# Patient Record
Sex: Female | Born: 1980 | Race: White | Hispanic: No | Marital: Married | Smoking: Current every day smoker
Health system: Southern US, Community
[De-identification: ages and names within clinical notes are randomized; demographics above are authoritative.]

## PROBLEM LIST (undated history)

## (undated) DIAGNOSIS — K589 Irritable bowel syndrome without diarrhea: Secondary | ICD-10-CM

## (undated) HISTORY — DX: Irritable bowel syndrome, unspecified: K58.9

---

## 2000-12-22 ENCOUNTER — Other Ambulatory Visit: Admission: RE | Admit: 2000-12-22 | Discharge: 2000-12-22 | Payer: Self-pay | Admitting: *Deleted

## 2001-02-19 ENCOUNTER — Ambulatory Visit (HOSPITAL_COMMUNITY): Admission: RE | Admit: 2001-02-19 | Discharge: 2001-02-19 | Payer: Self-pay | Admitting: *Deleted

## 2001-02-19 ENCOUNTER — Encounter: Payer: Self-pay | Admitting: *Deleted

## 2001-06-29 ENCOUNTER — Inpatient Hospital Stay (HOSPITAL_COMMUNITY): Admission: RE | Admit: 2001-06-29 | Discharge: 2001-07-01 | Payer: Self-pay | Admitting: *Deleted

## 2003-10-12 ENCOUNTER — Ambulatory Visit (HOSPITAL_COMMUNITY): Admission: RE | Admit: 2003-10-12 | Discharge: 2003-10-12 | Payer: Self-pay | Admitting: *Deleted

## 2004-03-01 ENCOUNTER — Inpatient Hospital Stay (HOSPITAL_COMMUNITY): Admission: AD | Admit: 2004-03-01 | Discharge: 2004-03-03 | Payer: Self-pay | Admitting: *Deleted

## 2006-06-10 HISTORY — PX: HYSTEROTOMY: SHX1776

## 2006-08-19 ENCOUNTER — Ambulatory Visit (HOSPITAL_COMMUNITY): Admission: RE | Admit: 2006-08-19 | Discharge: 2006-08-19 | Payer: Self-pay | Admitting: Obstetrics and Gynecology

## 2006-08-19 ENCOUNTER — Encounter (INDEPENDENT_AMBULATORY_CARE_PROVIDER_SITE_OTHER): Payer: Self-pay | Admitting: Specialist

## 2006-09-23 ENCOUNTER — Ambulatory Visit (HOSPITAL_COMMUNITY): Admission: RE | Admit: 2006-09-23 | Discharge: 2006-09-23 | Payer: Self-pay | Admitting: Obstetrics and Gynecology

## 2007-03-03 ENCOUNTER — Encounter (INDEPENDENT_AMBULATORY_CARE_PROVIDER_SITE_OTHER): Payer: Self-pay | Admitting: Obstetrics and Gynecology

## 2007-03-03 ENCOUNTER — Ambulatory Visit (HOSPITAL_COMMUNITY): Admission: RE | Admit: 2007-03-03 | Discharge: 2007-03-04 | Payer: Self-pay | Admitting: Obstetrics and Gynecology

## 2007-08-09 ENCOUNTER — Emergency Department (HOSPITAL_COMMUNITY): Admission: EM | Admit: 2007-08-09 | Discharge: 2007-08-09 | Payer: Self-pay | Admitting: Emergency Medicine

## 2007-08-09 HISTORY — PX: CHOLECYSTECTOMY: SHX55

## 2007-08-14 ENCOUNTER — Ambulatory Visit (HOSPITAL_COMMUNITY): Admission: RE | Admit: 2007-08-14 | Discharge: 2007-08-14 | Payer: Self-pay | Admitting: General Surgery

## 2007-08-14 ENCOUNTER — Encounter (INDEPENDENT_AMBULATORY_CARE_PROVIDER_SITE_OTHER): Payer: Self-pay | Admitting: General Surgery

## 2009-11-08 ENCOUNTER — Ambulatory Visit: Payer: Self-pay | Admitting: Vascular Surgery

## 2010-02-13 ENCOUNTER — Ambulatory Visit: Payer: Self-pay | Admitting: Vascular Surgery

## 2010-04-18 ENCOUNTER — Ambulatory Visit: Payer: Self-pay | Admitting: Vascular Surgery

## 2010-04-25 ENCOUNTER — Ambulatory Visit: Payer: Self-pay | Admitting: Vascular Surgery

## 2010-06-20 ENCOUNTER — Ambulatory Visit
Admission: RE | Admit: 2010-06-20 | Discharge: 2010-06-20 | Payer: Self-pay | Source: Home / Self Care | Attending: Vascular Surgery | Admitting: Vascular Surgery

## 2010-06-26 ENCOUNTER — Ambulatory Visit: Admit: 2010-06-26 | Payer: Self-pay | Admitting: Vascular Surgery

## 2010-06-26 ENCOUNTER — Ambulatory Visit
Admission: RE | Admit: 2010-06-26 | Discharge: 2010-06-26 | Payer: Self-pay | Source: Home / Self Care | Attending: Vascular Surgery | Admitting: Vascular Surgery

## 2010-07-06 NOTE — Procedures (Unsigned)
DUPLEX DEEP VENOUS EXAM - LOWER EXTREMITY  INDICATION:  Status post ablation of left GSV.  HISTORY:  Edema:  No Trauma/Surgery:  06/20/2010 left GSV ablation Pain:  Yes PE:  No Previous DVT:  No Anticoagulants:  No Other:  DUPLEX EXAM:               CFV   SFV   PopV  PTV    GSV               R  L  R  L  R  L  R   L  R  L Thrombosis    0  0     0     0      0     + Spontaneous   +  +     +     +      +     0 Phasic        +  +     +     +      +     0 Augmentation  +  +     +     +      +     0 Compressible  +  +     +     +      +     0 Competent     +  +     +     +      +     0  Legend:  + - yes  o - no  p - partial  D - decreased  IMPRESSION: 1. Left great saphenous vein successfully ablated approximately 1.5 cm     from junction to distal insertion point in the proximal calf. 2. No deep venous involvement is observed.   _____________________________ Larina Earthly, M.D.  LT/MEDQ  D:  06/26/2010  T:  06/26/2010  Job:  [id number]

## 2010-10-23 NOTE — Procedures (Signed)
LOWER EXTREMITY VENOUS REFLUX EXAM   INDICATION:  Varicose veins.   EXAM:  Using color-flow imaging and pulse Doppler spectral analysis, the  right and left common femoral, superficial femoral, popliteal, posterior  tibial, greater and lesser saphenous veins are evaluated.  There is no  evidence suggesting deep venous insufficiency in the right and left  lower extremities.   The right and left saphenofemoral junctions are not competent with  Reflux of >581milliseconds. The right and left GSV's are not competent  with Reflux of >59milliseconds with the caliber as described below.   The right and left proximal short saphenous veins demonstrate  competency.   GSV Diameter (used if found to be incompetent only)                                            Right         Left  Proximal Greater Saphenous Vein           0.45 cm       0.53 cm  Proximal-to-mid-thigh                     cm            cm  Mid thigh                                 0.47 cm       0.53 cm  Mid-distal thigh                          cm            cm  Distal thigh                              0.60 cm       0.50 cm  Knee                                      0.52 cm       0.61 cm   IMPRESSION:  1. The right and left greater saphenous veins are not competent with      reflux >54milliseconds.  2. The right and left greater saphenous veins are not tortuous.  3. The deep venous system is not competent with reflux >500      milliseconds.  4. The right and left short saphenous veins are competent.         ___________________________________________  Larina Earthly, M.D.   EM/MEDQ  D:  02/13/2010  T:  02/13/2010  Job:  [id number]

## 2010-10-23 NOTE — Assessment & Plan Note (Signed)
OFFICE VISIT   Courtney Velazquez, Courtney Velazquez  DOB:  10/27/1980                                       06/20/2010  [id number]#:[id number]   The patient underwent left great saphenous vein laser ablation from  midcalf to just below the saphenofemoral junction under local anesthesia  in our office.  She also had sclerotherapy to the area in the popliteal  space bilaterally.  She had no immediate complication, was placed in a  compression garment and will see [place] again in 1 week for duplex follow-  up.     Larina Earthly, M.D.  Electronically Signed   TFE/MEDQ  D:  06/20/2010  T:  06/21/2010  Job:  [id number]

## 2010-10-23 NOTE — Assessment & Plan Note (Signed)
OFFICE VISIT   VENIA, RIVERON  DOB:  [birth year]                                       04/18/2010  EAVWU#:[id number]   The patient presents today for the first of staged bilateral laser  ablation of her great saphenous vein for venous hypertension.  She  underwent uneventful laser ablation from her right  saphenous vein  from  her knee region up to just below her saphenofemoral junction.  She also  had sclerotherapy in the popliteal fossa on the right of 0.3% sodium  tetradecyl.  She will be seen again in 1 week for follow-up.     Larina Earthly, M.D.  Electronically Signed   TFE/MEDQ  D:  04/18/2010  T:  04/19/2010  Job:  [id number]

## 2010-10-23 NOTE — Procedures (Signed)
DUPLEX DEEP VENOUS EXAM - LOWER EXTREMITY   INDICATION:  Status post right greater saphenous vein laser ablation.   HISTORY:  Edema:  No.  Trauma/Surgery:  Right greater saphenous vein laser ablation on  04/18/2010.  Pain:  Right medial distal thigh tenderness.  PE:  No.  Previous DVT:  No.  Anticoagulants:  Other:   DUPLEX EXAM:                CFV   SFV   PopV  PTV    GSV                R  L  R  L  R  L  R   L  R  L  Thrombosis    o  o  o     o     o      +  Spontaneous   +  +  +     +     +      o  Phasic        +  +  +     +     +      o  Augmentation  +  +  +     +     +      o  Compressible  +  +  +     +     +      o  Competent     o  o  o     +     +      o   Legend:  + - yes  o - no  p - partial  D - decreased   IMPRESSION:  1. The right greater saphenous vein appears totally occluded,      extending from the distal insertion site to 1.9 cm from the      saphenofemoral junction.  2. No evidence of deep venous thrombosis noted in the right lower      extremity.  3. Reflux of >500 milliseconds noted in the bilateral common femoral      and right superficial femoral veins.    _____________________________  Larina Earthly, M.D.   CH/MEDQ  D:  04/25/2010  T:  04/25/2010  Job:  [id number]

## 2010-10-23 NOTE — H&P (Signed)
NAMEMONICKA, CYRAN               ACCOUNT NO.:  [id number]   MEDICAL RECORD NO.:  [id number]          PATIENT TYPE:  AMB   LOCATION:  SDC                           FACILITY:  [facility]   PHYSICIAN:  Randye Lobo, M.D.   DATE OF BIRTH:  1980/06/17   DATE OF ADMISSION:  03/03/2007  DATE OF DISCHARGE:                              HISTORY & PHYSICAL   DATE OF THE PREOPERATIVE HISTORY AND PHYSICAL EXAMINATION:  February 25, 2007   CHIEF COMPLAINT:  Painful menses.   HISTORY OF PRESENT ILLNESS:  The patient is a 30 year old para 2  Caucasian female, status post laparoscopy for dysmenorrhea on August 19, 2006, with biopsy-proven endometriosis, who presents with continued  painful menstruation and painful bowel movements and urination during  her menstrual cycles.  The patient reports that she is pain-free when  she is not menstruating.  The patient has used multiple hormonal  therapies in the past such as the NuvaRing, Ortho Evra, and multiple  birth control pills, which she has either not tolerated due to side  effects or has had continued dysmenorrhea and problems with breakthrough  bleeding.  The patient declines any future medical therapy and she  requests hysterectomy.  The patient uses vasectomy for birth control and  she declines future fertility.  A plan is made now to proceed with  surgical evaluation and further treatment of her pain.  Of note, the  patient did have a postoperative MRI performed on September 23, 2006, which  showed a possible focal adenomyoma of the uterus, although this could  not be definitive.   The patient's past obstetric and gynecologic history is remarkable for  two vaginal deliveries.  The patient's last Pap smear was performed in  February 2008 and was within normal limits.  She has a history of  polycystic-appearing ovaries from a prior ultrasound with an outside  Jermane Brayboy.   MEDICAL HISTORY:  Significant for irritable bowel syndrome, and she is a  smoker.   PAST SURGICAL HISTORY:  The patient is status post laparoscopy for  endometriosis in March 2008.   MEDICATIONS:  Ibuprofen p.r.n. headache.   ALLERGIES:  No known drug allergies.   SOCIAL HISTORY:  The patient has been married for the last 9 years.  She  is a Futures trader.  She smokes one-half package of cigarettes per day.  She  has set a quit date for her tobacco use of March 03, 2007.   REVIEW OF SYSTEMS:  The patient has intentionally lost 20 pounds over  the last 6 months.   FAMILY HISTORY:  Negative for breast, ovarian, uterine, or colon cancer.   PHYSICAL EXAMINATION:  Height is 5 feet 5 inches, weight is 148 pounds.  GENERAL:  The patient is a young Caucasian female in no acute distress.  HEENT:  Normocephalic, atraumatic.  LUNGS:  Clear to auscultation bilaterally.  HEART:  S1, S2 with a regular rate and rhythm.  There is no evidence of  a murmur, rub, or gallop.  ABDOMEN:  Demonstrates scattered laparoscopic incisions which are well  healed.  The abdomen is  soft and nontender and without evidence of  hepatosplenomegaly or organomegaly.  PELVIC EXAMINATION:  Demonstrates normal external genitalia and a normal  urethra.  The cervix demonstrates no lesions.  The uterus is small and  nontender.  No adnexal masses nor tenderness are appreciated.   IMPRESSION:  The patient is a 30 year old para 2 female with biopsy-  proven endometriosis and a possible current adenomyoma who has  dysmenorrhea which has failed medical therapy.   PLAN:  The patient will undergo a laparoscopically-assisted vaginal  hysterectomy.  Risks, benefits, and alternatives have been discussed  with the patient, who wishes to proceed.  The patient understands that  hysterectomy is not a guarantee that she will be 100% pain-free  following her recovery from her procedure.   The patient will stop her use of nonsteroidal anti-inflammatory  medication prior to her surgery.      Randye Lobo, M.D.  Electronically Signed     BES/MEDQ  D:  03/02/2007  T:  03/03/2007  Job:  [id number]

## 2010-10-23 NOTE — Op Note (Signed)
Courtney Velazquez, Courtney Velazquez               ACCOUNT NO.:  [id number]   MEDICAL RECORD NO.:  [id number]          PATIENT TYPE:  OIB   LOCATION:  9310                          FACILITY:  [facility]   PHYSICIAN:  Randye Lobo, M.D.   DATE OF BIRTH:  August 31, 1980   DATE OF PROCEDURE:  03/03/2007  DATE OF DISCHARGE:                               OPERATIVE REPORT   PROCEDURE:  Laparoscopically assisted vaginal hysterectomy, cystoscopy.   SURGEON:  Randye Lobo, M.D.   ASSISTANT:  Luvenia Redden, M.D.   ANESTHESIA:  General endotracheal, local.   IV FLUIDS:  2600 mL Ringer's lactate.   ESTIMATED BLOOD LOSS:  100 mL.   URINE OUTPUT:  300 mL.   COMPLICATIONS:  None.   INDICATIONS FOR PROCEDURE:  The patient is a 30 year old, para 2,  Caucasian female, status post laparoscopy for dysmenorrhea in March 2008  who has biopsy proven endometriosis, who presented with continued  dysmenorrhea, painful bowel movements, and painful urination during her  menstrual cycles.  The patient reports that she is pain free when she is  not menstruating.  The patient has tried multiple hormonal therapies  such a Nuva ring, Ortho-Evra, and multiple birth control pills which  have either caused intolerance due to side effects or continued  dysmenorrhea and break through bleeding.  The patient declines any  further medical therapy of any kind and she wishes to proceed with  definitive removal of the uterus.  She has had an MRI performed in April  2008 which showed a possible focal area of adenomyosis, although this  was not definitive.  The patient declines any future child bearing and  currently uses a vasectomy for pregnancy prevention.  A plan is now made  to proceed with a laparoscopically assisted vaginal hysterectomy after  risks, benefits, and alternatives are reviewed.   FINDINGS:  Laparoscopy demonstrated a normal uterus, tubes and ovaries.  There was a 2 cm left corpus luteum cyst appreciated.  There was a  0.75  cm area of bluish lesions of endometriosis lateral to the right  uterosacral ligament.  Otherwise, there was no evidence of any  endometriosis throughout the abdomen or pelvis.  There were Masters  windows lateral to the uterosacral ligaments bilaterally.  The liver and  bowel appeared to be unremarkable.  The gallbladder was not visualized  due to the bowel in this area.   Cystoscopy was performed at the termination of the hysterectomy portion  of the procedure and both the ureters were noted to be patent  bilaterally.  There was an area of some scattered erythema lateral to  the right ureter.  This was a very nonspecific erythema to the mucosa.  The urethra appeared to be unremarkable.   DESCRIPTION OF PROCEDURE:  The patient was reidentified in the  preoperative hold area.  She was taken to the operating room after she  received cefoxitin 1 gram IV.  The patient received both TED hose and  PAS stockings for DVT prophylaxis.  In the operating room, general  anesthesia was induced and the patient was then placed in a  dorsal  lithotomy position.  The abdomen and vagina were sterilely prepped and  draped.  A Foley catheter was placed inside the bladder.  A single tooth  tenaculum was placed on the anterior cervical lip and this was replaced  with a Hulka tenaculum.   A 1 cm umbilical incision was created sharply with a scalpel.  A 10 mm  trocar was inserted directly into the peritoneal cavity.  The  laparoscope confirmed proper placement.  A CO2 pneumoperitoneum was  achieved and the patient was placed in the Trendelenburg position.  5 mm  incisions were created with a scalpel in the right and left lower  quadrants.  5 mm trocars were inserted into the peritoneal cavity under  direct visualization of the laparoscope.  An inspection of the pelvic  and abdominal organs was performed and the findings are as noted above.   The procedure began on the patient's right hand side.  The  right ureter  was identified.  The proximal fallopian tube, right round ligament, and  the right utero-ovarian ligament were then sequentially cauterized with  the tripolar instrument and sharply divided.  The dissection continued  through the posterior leaf of the broad ligament using the same  instrument.  The uterine artery on the same side was then skeletonized  using the same instrument.  The bladder flap was not taken down in the  midline portion of the vesicouterine fold.  The same procedure that was  performed on the right was repeated on the left hand side after the left  ureter was also identified.  The right ureter was identified and the  lesion of endometriosis near the right uterosacral ligament was then  cauterized with the tripolar instrument, as the ureter was out of the  field of the endometriosis.   Hemostasis was good at this time and the remainder of the procedure was  performed vaginally.  The pneumoperitoneum was released and the  laparoscopic instruments were removed from the trocars.   The patient was then placed in the high lithotomy position.  A weighted  speculum was placed inside the vagina and the Hulka was replaced by a  tenaculum on the anterior and posterior cervical lips.  The cervix was  circumferentially injected with 0.5% lidocaine with 1:200,000 of  epinephrine.  The cervix was then circumscribed with a scalpel.  The  posterior cul-de-sac was entered sharply with Mayo scissors.  Digital  exam confirmed proper entry into this location.  A long weighted  speculum was placed inside the posterior cul-de-sac.   Each of the uterosacral ligaments were then clamped, sharply divided,  and suture ligated with transfixing sutures of 0 Vicryl.  The bladder  was dissected off of the cervix anteriorly.  The cardinal ligaments were  then sequentially clamped, sharply divided, and suture ligated with 0  Vicryl suture.  The cervix was noted to be fairly long and  this needed  to be performed in multiple bites.  The anterior cul-de-sac was then  entered sharply and, again, digital exam confirmed proper entry into  this location.  The Deaver was placed into the anterior cul-de-sac.  The  remainder of the cardinal ligaments were then clamped, sharply divided,  and suture ligated with 0 Vicryl.  At this time, the dissection met the  cautery line from the laparoscopic portion of the procedure and the  specimen was sharply excised.  Transfixing sutures of 0 Vicryl were  placed along the top pedicles on each side.  The  uterus and cervix were  then sent to pathology.  The posterior vaginal cuff was whip stitched  with a running locked suture of 0 Vicryl.  There was some bleeding noted  along the uterosacral ligament on the patient's right hand side.  A  figure-of-eight suture needed to be placed along the upper distal  portion of the uterosacral ligament on the patient's right hand side.   The culdoplasty suture was performed at this time.  The suture of 0  Vicryl was brought through the vagina and into the posterior cul-de-sac  at the 6 o'clock position, down through the distal left uterosacral  ligament, across the posterior cul-de-sac in a pursestring fashion, down  through the distal right uterosacral ligament and then out through the  cul-de-sac and into the vagina, again at the 6 o'clock position.  The  remaining pedicles were examined and found to be hemostatic.  The vagina  was closed with a running locked suture of 0 Vicryl.  The McCall's  culdoplasty suture was tied for good elevation and support of the  vagina.   A decision was made to proceed with the cystoscopy based on the figure-  of-eight suture which needed to be placed near the uterosacral ligament  on the patient's right-hand side.  This did document patent ureters  bilaterally.  The indigo carmine was noted to flux from each of the  ureters.  The Foley catheter was replaced and the  cystoscopy fluid was  drained from the bladder.   Final laparoscopy was performed after the CO2 pneumoperitoneum was  achieved.  There was some bleeding from the underside of the bladder  anteriorly and this responded to coagulation with the tripolar  instrument.  The stump of the right fallopian tube and right utero-  ovarian ligament were also coagulated.  With good hemostasis at this  time, when the pneumoperitoneum was partially released, the decision was  made to finish the procedure.  The laparoscopic trocars were removed  under visualization of the laparoscope.  The pneumoperitoneum was  entirely released and the umbilical trocar and the laparoscope were  removed simultaneously.  The skin incisions were closed with  subcuticular sutures of 4-0 Vicryl.  Pressure dressings were placed over  this.   The patient was cleansed of Betadine, extubated, and escorted to the  recovery room in stable and awake condition.  There were no  complications to the procedure.  All needle, instrument, sponge counts  were correct.      Randye Lobo, M.D.  Electronically Signed     BES/MEDQ  D:  03/03/2007  T:  03/04/2007  Job:  [id number]   cc:   Corrie Mckusick, M.D.  Fax: [phone]

## 2010-10-23 NOTE — Consult Note (Signed)
NEW PATIENT CONSULTATION   GWENDOLINE, JUDY  DOB:  11-May-1981                                       11/08/2009  ZOXWR#:[id number]   The patient presents today for evaluation of lower extremity discomfort.  She is a very pleasant 30 year old white female with several components  of the discomfort in her lower extremities.  Of note, during 2 prior  pregnancies, she had marked varicosities in her lateral right ankle,  medial right calf, and medial right thigh.  These were more pronounced  with her second pregnancy and to a great degree resolved after delivery.  She continues to have some fullness over these areas and does report a  sensation of heaviness bilaterally.  This is somewhat improved with  elevation.  She does not have a history of DVT or bleeding.  She has a  second component where she notes discoloration of her lower extremities.  She reports that if she stands, she has some blotchiness and red-purple  discoloration beginning at her feet and extending up to her thighs.  She  reports this can occur in her hands, but to a lesser degree.   Her past history is significant only for migraines with aura.  She does  have a history of partial hysterectomy, cholecystectomy, and history of  endometriosis.   FAMILY HISTORY:  Negative for premature atherosclerotic disease.   SOCIAL HISTORY:  She is married with 2 children.  She is a Futures trader.  She smokes 1/2 to 1 pack of cigarettes per day and has a quit date  planned for [birth year] her birthday.   REVIEW OF SYSTEMS:  She denies any weight loss or weight gain.  Her  weight is 134 pounds.  She is 5 feet 4 inches tall.  She has shortness of breath with exertion.  GI/GU/CARDIAC/VASCULAR:  Otherwise negative.  NEUROLOGIC:  Positive for dizziness and headache.  MUSCULOSKELETAL/PSYCHIATRIC/ENT/HEMATOLOGIC/SKIN:  Negative.   PHYSICAL EXAM:  Well-developed, well-nourished, white female appearing  stated age.  Blood  pressure is 113/73, heart rate 105, respirations 16.  She is in no acute stress.  HEENT is normal.  Musculoskeletal shows no  major deformity or cyanosis.  Neurologic:  No focal weakness or  paresthesias.  Skin without ulcers or rashes.  She does have prominent  saphenous vein with some moderate varices in her medial right thigh.  She does have telangiectasia and reticular veins in her popliteal fossae  bilaterally.   She underwent handheld SonoSite duplex by myself and this shows enlarged  saphenous vein with reflux bilaterally.  I have discussed this at length  with the patient.  I do not feel that her discoloration is related to  significant arterial venous pathology.  She does have normal 2+ dorsalis  pedis pulses bilaterally.  I feel that this is related to more of a  vasospastic issue with flow to her skin.  This is particularly in light  of the transient nature of this.  She was reassured with this.  I did  explain treatment options for her venous hypertension.  We have fitted  her today with 20 to 30 mmHg graduated compression garments, thigh-high,  and instructed on the use of these.  We will see her again in 3 months  for followup and formal venous duplex.     Larina Earthly, M.D.  Electronically Signed  TFE/MEDQ  D:  11/08/2009  T:  11/09/2009  Job:  [id number]   cc:   [facility], [place], [place]  Corrie Mckusick, M.D.

## 2010-10-23 NOTE — Assessment & Plan Note (Signed)
OFFICE VISIT   Courtney Velazquez, Courtney Velazquez  DOB:  May 31, 1981                                       04/25/2010  AYTKZ#:[id number]   The patient presents today for 1-week follow-up after right great  saphenous vein laser ablation.  She did quite well with the procedure  and has had minimal discomfort following this.   On physical exam she has the typical amount of bruising related to this  and mild tenderness.  She does have obvious clot in her saphenous vein  as anticipated on physical exam.  She underwent venous duplex today and  this shows closure of her saphenous vein from the insertion site at the  level of the knee to just below the saphenofemoral junction and no  evidence of DVT.  I am quite pleased with her initial result, as is the  patient.  We will plan to see her again in early January for a staged  left great saphenous vein ablation.     Larina Earthly, M.D.  Electronically Signed   TFE/MEDQ  D:  04/25/2010  T:  04/26/2010  Job:  [id number]   cc:   Dr. Rema Fendt

## 2010-10-23 NOTE — Assessment & Plan Note (Signed)
OFFICE VISIT   Courtney Velazquez, Courtney Velazquez  DOB:  1980-07-18                                       02/13/2010  UEAVW#:[id number]   The patient presents today for continued discussion regarding venous  hypertension in both lower extremities.  I had seen her initially in  June of 2011.  She has worn her graduated compression stockings and  reports that she still has difficulty with leg pain.  Any activities  requiring prolonged standing, specifically cooking and cleaning  activities childcare, are difficult due to these leg pains.  She reports  that with prolonged standing she gets an achy sensation from her knees  down into her ankles.  She does not have any history of DVT.  She  underwent a venous duplex in our office and this shows reflux in her  great saphenous veins bilaterally and also at the common femoral vein at  the saphenofemoral junction.  She does not have any DVT.  I discussed  these with the patient and her husband present.  I explained that she  clearly does have venous reflux in her superficial system and that this  would typically be responsible for the type of pain that she is having.  I did discuss the option of a staged bilateral laser ablation for venous  hypertension.  She understands and wishes to proceed.  She reports that  the right leg is somewhat worse in her left and we will proceed with  this initially.  She understands the treatment and recovery and the  potential risks.     Larina Earthly, M.D.  Electronically Signed   TFE/MEDQ  D:  02/13/2010  T:  02/14/2010  Job:  [id number]   cc:   Dr. Assunta Found

## 2010-10-23 NOTE — Assessment & Plan Note (Signed)
OFFICE VISIT   Courtney Velazquez, Courtney Velazquez  DOB:  1980-12-22                                       06/26/2010  ZOXWR#:[id number]   The patient presents today for follow-up of her left leg laser ablation  of her left great saphenous vein on 06/20/2010.  She had her right leg  treatment approximately 2 weeks prior to this..  She does have some  irritation over the popliteal space with some linear blisters related to  the compression garment.  These are healing.  She also has some numbness  over the left pretibial area related to irritation of the saphenous  nerve and I have discussed this.  I explained the resolution of this  with this patient and her husband present.  She has minimal bruising and  minimal swelling.  She underwent repeat venous duplex today and this  shows successful ablation of her left great saphenous vein from the  distal insertion site in the mid calf to the level of the left femoral  junction, approximately 1.5 cm below the junction, and no evidence of  DVT.  I am quite pleased with her result as is this patient.  She will  continue her activity.  She will discontinue the use of her compression  garment due to the blistering and irritation of the popliteal space.  She will see [place] again on an as-needed basis.     Larina Earthly, M.D.  Electronically Signed   TFE/MEDQ  D:  06/26/2010  T:  06/27/2010  Job:  [id number]

## 2010-10-23 NOTE — Op Note (Signed)
Courtney Velazquez, Courtney Velazquez               ACCOUNT NO.:  [id number]   MEDICAL RECORD NO.:  [id number]          PATIENT TYPE:  AMB   LOCATION:  DAY                           FACILITY:  [facility]   PHYSICIAN:  Courtney Pillar, MD      DATE OF BIRTH:  [birth year]   DATE OF PROCEDURE:  08/14/2007  DATE OF DISCHARGE:                               OPERATIVE REPORT   PREOPERATIVE DIAGNOSIS:  Cholelithiasis.   POSTOPERATIVE DIAGNOSIS:  Cholelithiasis.   PROCEDURE:  Laparoscopic cholecystectomy.   SURGEON:  Courtney Pillar, MD.   ANESTHESIA:  1. General endotracheal.  2. Local anesthetic 1% Sensorcaine plain.   ESTIMATED BLOOD LOSS:  Scant.   SPECIMEN:  Gallbladder.   INDICATIONS:  The patient is a 30 year old female who presented to my  office as an outpatient with an approximate 5 day history of right upper  quadrant and epigastric abdominal pain.  She had an extensive workup  demonstrating gallstones on a right upper quadrant ultrasound as well as  some gallbladder wall thickening.  She was treated initially with  conservative management with pain medication with some resolution of her  symptomatology but had a continuous persistent aching and right upper  quadrant abdominal pain consistent with resolving acute cholecystitis.  The risks, benefits and alternatives of laparoscopic, possible open,  cholecystectomy were discussed at length with the patient.  The risks  including, but not limited to, the risks of bleeding, infection, bile  leak, small bowel injury, common bile duct injury as well as the  possibility of intraoperative cardiac or pulmonary events were discussed  at length with the patient.  The patient's questions and concerns were  addressed and patient was consented for the planned procedure.   OPERATION:  The patient was taken to the operating room and was placed  in the supine position on the operating table, at which time a general  anesthetic was administered.  Once the patient  was asleep she was  endotracheally intubated by Anesthesia.  At this point, her abdomen was  prepped and draped in the usual fashion.  A stab incision was created  supraumbilically with an 11 blade scalpel.  Additional dissection down  into the subcuticular tissue was carried out using a Kocher clamp which  was utilized to grasp the anterior abdominal fascia and lift this  anteriorly.  A Veress needle was inserted.  A saline drop test was  utilized to confirm intraperitoneal placement and then pneumoperitoneum  was initiated.  Once sufficient pneumoperitoneum was obtained an 11 mm  trocar was inserted over the laparoscope, allowing visualization of the  trocar entering into the peritoneum.  The inner cannula was then removed  and the laparoscope was reinserted.  There was no evidence of any Veress  or trocar placement injury.  At this time the remaining trocars were  placed with an 11 mm trocar in the epigastrium, a 5 mm trocar between  the two 11 mm trocars, and a 5 mm trocar in the right lateral abdominal  wall; these were all placed in a similar fashion making a skin incision  and placement of  the trocars under direct visualization.  At this point  the fundus of the gallbladder was identified, it was lifted up and over  the right lobe of the liver.  Blunt dissection was utilized to strip the  peritoneum off the infundibulum of the gallbladder, this allowed  visualization of the cystic artery which was anterior.  A window was  created behind this.  This was clearly heading towards the gallbladder.  Two Endoclips were placed proximally, one distally and the cystic artery  was divided between the two most distal clips.  Similarly, the cystic  duct was identified.  A window was created behind the cystic duct.  Three Endoclips were placed proximally, one distally and the cystic duct  was divided between the most distal clips.  At this point electrocautery  was utilized to dissect the  gallbladder free from the gallbladder fossa.  A small cholecystotomy was created during this, allowing some bile  spillage but this was minimal.  The stones were observed within the lost  bile.  The gallbladder was removed and placed in the endo catch bag  which was placed up and over the right lobe of the liver.  At this point  the suction ureter was brought to the field, it was utilized to irrigate  copiously the gallbladder fossa until the returning aspirate was clear.  Electrocautery was utilized to obtain hemostasis on the gallbladder  fossa and a piece of Surgicel was placed into the gallbladder fossa.  At  this point, the attention was turned to closure after re-evaluating the  staples, noting that they were in excellent position with no evidence of  any bleeding or bile leak.   At this time, using an Endo Close suture passing device a #2-0 Vicryl  was passed through both 11 mm trocar sites.  With these sutures in  place, the endo catch bag was grasped and pulled through the epigastric  trocar site.  The gallbladder was removed in an intact endo catch bag  which was placed on the back table and was sent as a permanent specimen  to pathology.  At this point pneumoperitoneum was evacuated, the Vicryl  sutures were secured.  A #4-0 Monocryl was utilized in a subcuticular  running suture at all 4 trocar sites to reapproximate the skin edges.  At this point, the local anesthetic was instilled, the skin was washed  and dried with a moistened dry towel, benzoin was applied around the  incision, 1/2-inch Steri-Strips were placed and the drapes were removed.  The patient was allowed to come out of general anesthetic and was  transferred back to her regular hospital bed in stable condition.  At  the conclusion of the procedure, all instrument, sponge and needle  counts were correct.  The patient tolerated the procedure well.      Courtney Pillar, MD  Electronically Signed     BZ/MEDQ   D:  08/14/2007  T:  08/14/2007  Job:  [id number]   cc:   Dr. Phillips Odor

## 2010-10-23 NOTE — H&P (Signed)
Courtney Velazquez, Courtney Velazquez               ACCOUNT NO.:  [id number]   MEDICAL RECORD NO.:  [id number]          PATIENT TYPE:  AMB   LOCATION:  DAY                           FACILITY:  [facility]   PHYSICIAN:  Tilford Pillar, MD      DATE OF BIRTH:  10-14-1980   DATE OF ADMISSION:  DATE OF DISCHARGE:  LH                              HISTORY & PHYSICAL   CHIEF COMPLAINT:  Right upper quadrant epigastric abdominal pain.   HISTORY OF PRESENT ILLNESS:  Patient is a 30 year old female, who  presents to my office after approximately a one-week history of  increasing epigastric abdominal pain.  She has had similar symptoms in  the past, but never this severe and never this long a duration.  She  then states that, on Friday, the pain became worse, has had positive  emesis on multiple occasions with increasing nausea.  She started to  have resolution of her symptomatology and then ate potato soup and noted  increasing abdominal pain following that.  The pain has been described  as a sharp abdominal pain with radiation to the back.  She did have a  temperature on Friday of 102 degrees, Fahrenheit, taken by oral  thermometer.  She has had no other febrile spikes since that time.  She  has had increasing flatus and has had some diarrhea.  She has not  noticed any hematochezia, no melena.  She has had no acholic stools.  She has had no history of jaundice.   PAST MEDICAL HISTORY:  None.   PAST SURGICAL HISTORY:  Positive for a vaginal hysterectomy in 2008.   MEDICATIONS:  Reglan, Vicodin, both of which were started in the  emergency department over the weekend.  No prior medications.   ALLERGIES:  No known drug allergies.   SOCIAL HISTORY:  She is a half-pack to one-pack-per-day smoker.  No  alcohol use.  No recreational drug use.  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  Pregnancies times two.   FAMILY HISTORY:  She does have significant family history of biliary  disease, no history of biliary cancers, no  history of GI cancers.   REVIEW OF SYSTEMS:  CONSTITUTIONAL:  Positive fevers and chills, as per  HPI.  Occasional headaches.  EYES:  Unremarkable.  EARS, NOSE, THROAT:  Unremarkable.  RESPIRATORY:  Unremarkable.  CARDIOVASCULAR:  Unremarkable.  GASTROINTESTINAL:  Abdominal pain, nausea and vomiting,  all as per HPI.  GENITOURINARY:  Unremarkable, although on questioning,  she does admit to some dysuria.  She has denied any hematochezia.  MUSCULOSKELETAL:  She has history of back arthralgias.  SKIN:  Unremarkable.  ENDOCRINE:  Unremarkable.  NEUROLOGIC:  Occasional  sensations of feeling dizzy.   PHYSICAL EXAM:  On exam, patient is a healthy, well-developed, calm  individual.  She is in no acute distress.  She is alert and oriented  times three.  HEENT:  Scalp - no deformities, no masses.  Eyes - pupils equal, round,  reactive.  Extraocular movements are intact.  No scleral icterus, no  conjunctival pallor.  Ears - no diminished hearing on evaluation.  Oral  mucosa pink.  Normal occlusion.  NECK:  Trachea is midline.  No cervical lymphadenopathy is apparent.  PULMONARY:  Unlabored respirations.  No wheezing, no crackles.  She is  clear to auscultation in bilateral lung fields.  CARDIOVASCULAR:  Regular rate and rhythm.  No murmurs or gallops are  appreciated.  She has 2+ radial pulses bilaterally.  ABDOMINAL EXAM:  Abdomen is soft.  She has positive bowel sounds.  She  does have mild to moderate right upper quadrant abdominal pain.  She has  no actual elicited Murphy sign.  No hernias, no masses are apparent.  SKIN:  Warm and dry.   PERTINENT LABORATORY AND RADIOGRAPHIC STUDIES:  Right upper quadrant  ultrasound demonstrated gallbladder stones.  Recent CBC demonstrated a  normal white blood cell count.  A liver panel was within normal limits.   ASSESSMENT AND PLAN:  Cholelithiasis.  At this time, patient continues  to have right upper quadrant abdominal pain and in palpation of the  area  of the gallbladder, although she did not have a classic Murphy's sign.  Her pain and symptomatology and clinical presentation are consistent  with biliary disease and suspect she has continued inflammatory changes  secondary to this.  The risks, benefits, and alternatives of a  laparoscopic, possible open, cholecystectomy were discussed at length  with the patient, including, but not limited to the risk of bleeding,  infection, bile leak, small-bowel injury, common bile duct injury, as  well as the possibility of intraoperative pulmonary or cardiac events.  At this time, it was also discussed with the patient that, although her  laboratory and physical evaluations do not suggest other etiologies for  her symptomatology, symptoms such as gastroesophageal reflux or  ulcerative disease of the stomach or duodenum are also possible and  could be contributing to some of her symptomatology and should any of  her symptoms persist after removal of her gallbladder, additional workup  with gastroenterology evaluation would be necessary.  Patient  understands this and does wish to proceed with a laparoscopic, possible  open, cholecystectomy.      Tilford Pillar, MD  Electronically Signed     BZ/MEDQ  D:  08/11/2007  T:  08/12/2007  Job:  [id number]   cc:   Corrie Mckusick, M.D.  Fax: [phone]

## 2010-10-26 NOTE — Discharge Summary (Signed)
Courtney Velazquez, Courtney Velazquez               ACCOUNT NO.:  [id number]   MEDICAL RECORD NO.:  [id number]          PATIENT TYPE:  INP   LOCATION:  A402                          FACILITY:  [facility]   PHYSICIAN:  Langley Gauss, M.D.   DATE OF BIRTH:  [birth year]   DATE OF ADMISSION:  03/01/2004  DATE OF DISCHARGE:  09/24/2005LH                                 DISCHARGE SUMMARY   DATE OF EPIDURAL:  March 02, 2004.   DATE OF DELIVERY:  March 02, 2004.   DISCHARGE DIAGNOSES:  Term intrauterine pregnancy, presenting in early  labor.   DELIVERY FORM:  1.  Spontaneous assisted vaginal delivery of a 7-pound, 6-ounce female      infant.  2.  Repair of second-degree perineal laceration as well as vaginal sidewall      tears.   COMPLICATIONS:  Nuchal cord x2 is reduced at the time of delivery.  No  compression was noted during the course of labor.  Additionally, postpartum  hemorrhage immediate, secondary to uterine atony and laceration repair, with  resultant anemia.   PERTINENT LABORATORY STUDIES:  Admission hemoglobin and hematocrit 11.1/31.1  with a white count of 9.2.  Immediately following delivery in the early  a.m., hemoglobin 8.2, hematocrit 23.1.  Equilibration has now occurred on  postpartum day #1 to hemoglobin of 6.5, hematocrit 18.6 with a white count  of 9.2.  O positive blood type.  RPR is nonreactive.  GBS status is  negative.   DISPOSITION:  Follow up in the office in one week's time to check suture  healing, also to check response to iron therapy.   DISCHARGE MEDICATIONS:  1.  Tylox #30 for postpartum uterine cramping.  2.  Hemocyte F, one p.o. daily, #30, with one refill.   DISCHARGE INSTRUCTIONS:  1.  The patient is advised of the importance of adherence to the iron      therapy.  2.  In addition, she is advised that should any heavy bleeding occur, she      needs to contact us immediately as she does not have a large amount of      reserve circulatory volume.   HOSPITAL COURSE:  Pertinently, the patient has had no significant bleeding  since the laceration repair was continued.  Thus, it can not be contended  that the patient is at risk for renewed bleeding.  She has been fully  ambulatory during this postpartum stay, and has tolerated the anemia very  well.  She has only some weakness, but no complaints of a chronic headache.  No fainting episodes.  In addition, she is absolutely positively adverse to  receiving any blood products per personal choice.   The patient presented the p.m. of March 01, 2004, in early labor.  Amniotomy was performed with findings of clear amniotic fluid.  Fetal scalp  electrode documented reassuring fetal heart rate.  Delivery was performed  with resultant laceration repair.  Postpartum, the patient did well.  She  did have one episode of almost fainting immediately following delivery.  However, she was in her husband's arms.  Subsequently, she did  well during  the postpartum course.  She has breast fed.  No signs or symptoms of anemia  at the time of discharge.  The perineum is examined.  The repair is noted to  be intact and permits passage of two fingers.  No resultant bleeding during  the examination.  The patient is thus discharged to home, March 03, 2004.      DC/MEDQ  D:  03/03/2004  T:  03/03/2004  Job:  [id number]

## 2010-10-26 NOTE — Op Note (Signed)
Courtney Velazquez, Courtney Velazquez               ACCOUNT NO.:  [id number]   MEDICAL RECORD NO.:  [id number]          PATIENT TYPE:  INP   LOCATION:  A402                          FACILITY:  [facility]   PHYSICIAN:  Langley Gauss, M.D.   DATE OF BIRTH:  September 03, 1980   DATE OF PROCEDURE:  03/02/2004  DATE OF DISCHARGE:                                 OPERATIVE REPORT   DIAGNOSIS:  Thirty-eight plus week intrauterine pregnancy in labor.   PROCEDURES:  March 03, 2004, placement of continuous lumbar epidural  analgesia.  March 03, 2004,  A.  Spontaneous cystovaginal delivery of 7 pound 6 ounce female infant.  B.  Repair of second degree perineal laceration.  C.  Repair of vaginal extensions.  D.  Epidural.   COMPLICATIONS OF DELIVERY:  A nuchal cord x2 without compression was noted  at time of delivery.  In addition, encountered was postpartum hemorrhage  secondary to a combination of uterine atony as well as bleeding from the  vaginal lacerations.   Delivery performed by Dr. Roylene Reason. Lisette Grinder.   ESTIMATED BLOOD LOSS:  600 mL.   ANALGESIA:  Epidural supplemented with 20 mL 1% lidocaine plain in the  midline of the perineal body.   PEDIATRICIAN:  Dr. Phillips Odor.   SPECIMENS:  Arterial cord gas and cord blood to pathology.   LABORATORY:  Placenta was examined and noted to be apparently intact with a  3-vessel umbilical cord.  A CBC is requested postpartum.   SUMMARY:  Patient presented p.m. of March 02, 2004, having bloody show  after a digital examination in the office.  She had been noted to be 3 cm.  When she arrived to labor and delivery she was 4 cm dilated, having regular  uterine contractions.  Amniotomy was performed.  Findings revealed clear  amniotic fluid, fetal scalp electrode is placed.  Initially, patient  requested only IV Nubain and Phenergan for pain relief.  However, in the  early a.m. of March 03, 2004, patient requested epidural.  Epidural was  placed without  difficulty.  Subsequently, patient progressed fairly rapidly  from 9+ to 10 cm dilatation.  She was then managed expectantly with an  excellent epidural block in place.  There was descent to the vertex to the  perineal floor, at which time she was placed in the dorsal lithotomy  position, prepped and draped in the usual sterile manner.  The bladder had  been straight cathed with a red rubber catheter placed by the nursing staff.  The patient pushed well during a short second stage of labor.  Excellent  perineal support was provided and a well controlled delivery.  Mouth and  nares bulb suctioned of clear amniotic fluid.  Nuchal cord x2 is reduced.  Spontaneous rotation occurred to her right anterior shoulder position.  Gentle downward traction combined with expulsive efforts resulted in  delivery of this anterior shoulder as well as the remainder of the infant  without difficulty.  Umbilical cord is milked towards the infant, cord is  doubly clamped and cut.  Infant is placed on the maternal abdomen for  bonding  purposes.  Arterial cord gas and cord blood are then obtained.  General traction on the umbilical cord results in separation which, upon  examination, is thought to be an intact placenta with associated three-  vessel umbilical cord.  Examination of genital tract reveals second degree  perineal laceration after infiltrating 20 mL 1% lidocaine plain.  This was  repaired utilizing 0 chromic in a running locked fashion on the vaginal  mucosa followed by two layer closure of 0 chromic on the perineal body.  There continues to be bright red vaginal bleeding noted.  Fundal massage is  performed with evacuation of several clots and obtaining excellent uterine  tone with the fundus beneath the umbilicus.  There continues to be bright  red bleeding.  A vaginal packing is placed at this time which does reveal  that active bleeding is noted to be near the vaginal opening.  Examination  at this  time reveals two extensions of the vaginal lacerations on both the  right and the left side.  These required additional suturing with 0 chromic  in a running lock fashion to assure hemostasis.  This restores the normal  anatomy.  The vaginal packing is removed and only a pad is placed  externally.  Patient tolerated the procedure very well.  She did have some  hypotension with a systolic blood pressure in the 70s but pulse remained  100.  With continuation of IV fluids, systolic blood pressure returned to  the 90s.  Following delivery, patient is taken out of dorsal lithotomy  position, she is rolled to her side, at which time the epidural catheter was  removed with the blue tip noted to be intact.   TOTAL ESTIMATED BLOOD LOSS:  600 mL.   CBC is requested following delivery.      DC/MEDQ  D:  03/02/2004  T:  03/03/2004  Job:  [id number]

## 2010-10-26 NOTE — Op Note (Signed)
Courtney Velazquez, Courtney Velazquez               ACCOUNT NO.:  [id number]   MEDICAL RECORD NO.:  [id number]          PATIENT TYPE:  AMB   LOCATION:  SDC                           FACILITY:  [facility]   PHYSICIAN:  Randye Lobo, M.D.   DATE OF BIRTH:  07/17/1980   DATE OF PROCEDURE:  08/19/2006  DATE OF DISCHARGE:                               OPERATIVE REPORT   PREOPERATIVE DIAGNOSES:  1. Dysmenorrhea.  2. Dyspareunia.   POSTOPERATIVE DIAGNOSES:  1. Dysmenorrhea.  2. Dyspareunia.  3. Mild endometriosis.   PROCEDURE:  Laparoscopy with peritoneal biopsy.   SURGEON:  Conley Simmonds, M.D.   ANESTHESIA:  General endotracheal, local with 0.25% Marcaine.   IV FLUIDS:  1100 mL Ringer's lactate.   ESTIMATED BLOOD LOSS:  Minimal.   URINE OUTPUT:  Quantity sufficient.   COMPLICATIONS:  None.   INDICATIONS FOR PROCEDURE:  The patient is a 30 year old para 2  Caucasian female who presented with persistent painful menses.  The  patient was also reporting painful intercourse and she describes painful  bowel movements and painful voiding during her menstruation.  The  patient had previously had an ultrasound on September 17, 2005 documenting  polycystic ovaries and a normal uterus.  The patient has been treated  with oral contraceptives which she has not tolerated for various  reasons.  Nonsteroidal anti-inflammatory medication has not been helpful  to control the patient's pain.  The patient wishes to proceed with  surgical evaluation and treatment of her pain and a plan is made to  proceed with laparoscopy with possible treatment of endometriosis and  lysis of adhesions.  Risks, benefits, and alternatives have been  discussed with the patient who wishes to proceed.   FINDINGS:  Laparoscopy demonstrated a small 1 cm area of reddish blue  lesions of endometriosis along the right pelvic sidewall lateral to the  midportion of the uterosacral ligament.  There was also what appeared to  be a possible Masters  window to the left of the left uterosacral  ligament.  No lesions of endometriosis were seen in the Masters window.  The remaining peritoneum throughout the pelvis appeared to be  unremarkable.  The right ovary contained a small corpus luteum cyst.  There was otherwise no cyst formation of the ovaries.  The fallopian  tubes appeared to be normal and the uterus was unremarkable.  The  appendix was visualized all the way to its tip and contained no lesions  of endometriosis.  The liver, gallbladder and stomach organs were  normal.  There was no adhesive disease in the abdomen or the pelvis.   PROCEDURE:  The patient was reidentified in the preoperative hold area.  She was brought to the operating room after she received Ancef 1 gram  IV.   In the operating room, general endotracheal anesthesia was induced and  the patient was then placed in the dorsal lithotomy position.  The  abdomen and vagina were sterilely prepped and draped.  A Foley catheter  was placed inside the bladder.  A speculum was placed inside the vagina  and a single-tooth tenaculum was  placed on the anterior cervical lip.  This was replaced with a Hulka tenaculum and the remaining vaginal  instruments were removed.   The procedure began by creating a 1 cm umbilical incision.  The incision  was carried down to the fascia using an Allis clamp.  A 10-mm trocar was  inserted directly into the peritoneal cavity and the laparoscope  confirmed proper placement.  The pneumoperitoneum was achieved and the  patient was placed in the Trendelenburg position.  A 5 mm left lower  quadrant incision was created with the scalpel and a 5-mm trocar was  placed under direct visualization of laparoscope.  An inspection of the  pelvic and abdominal organs was performed and the findings are as noted  above.   An additional 5 mm suprapubic incision was created with the scalpel and  a 5-mm trocar was again placed under visualization of the  laparoscope.  The lesions of endometriosis were identified along the right pelvic  sidewall and the position of the right ureter was noted and confirmed to  be lateral to this.  The grasper was then used to elevate the peritoneum  overlying the endometriosis and sharp dissection was used to excise the  peritoneum containing the endometriosis.  The specimen was removed from  the peritoneal cavity and was sent to pathology.  Bipolar cautery was  used to create hemostasis along the area of dissection which included a  portion of the uterosacral ligament on the patient's right-hand side.  The ureter was identified prior to performing the cautery with a  Kleppinger forceps.  Hemostasis was noted to be good.   The pelvis was irrigated and suctioned and hemostasis was noted to be  satisfactory at this time and the procedure was therefore completed.   The pneumoperitoneum was partially released and the operative site was  reexamined and found to be hemostatic.  The lower abdominal trocars were  removed and visualization of the laparoscope.  The 10 mm umbilical  trocar and the laparoscope were removed simultaneously.   All skin incisions were closed with subcuticular sutures of 3-0 plain  gut suture.  The skin was injected with 0.25% Marcaine.  Steri-Strips  were placed over the incisions after the patient was cleansed with  Betadine.   The Foley catheter and all vaginal instruments were removed.  The  patient was taken out of the dorsal lithotomy position, awakened, and  extubated.  She was escorted to the recovery room in stable and awake  condition.   There were no complications to the procedure.  All needle, instrument,  and sponge counts were correct.      Randye Lobo, M.D.  Electronically Signed     BES/MEDQ  D:  08/19/2006  T:  08/21/2006  Job:  [id number]

## 2010-10-26 NOTE — H&P (Signed)
Courtney Velazquez, Courtney Velazquez               ACCOUNT NO.:  [id number]   MEDICAL RECORD NO.:  [id number]          PATIENT TYPE:  INP   LOCATION:  LDR1                          FACILITY:  [facility]   PHYSICIAN:  Langley Gauss, M.D.   DATE OF BIRTH:  11-Dec-1980   DATE OF ADMISSION:  03/01/2004  DATE OF DISCHARGE:  LH                                HISTORY & PHYSICAL   HISTORY OF PRESENT ILLNESS:  The patient is a gravida 2, para 1, at 38-1/2  weeks' gestation, who was seen in the office on March 01, 2004, at which  time she was noted to be 3-cm dilated.  Following examination, she was noted  to have some bloody show at home; she contacted the office; she was advised  to present to labor and delivery.  Examination in labor and delivery  revealed her to be 4-cm dilated, 60% effaced, minus 1 station, posterior  position of the cervix.  She was having uterine contractions every 3-6  minutes; she was admitted at that time.  Amniotomy was performed; findings  showed clear amniotic fluid and the patient spontaneously entered active  labor.  She is noted to be GBS-negative.      DC/MEDQ  D:  03/02/2004  T:  03/02/2004  Job:  [id number]

## 2010-10-26 NOTE — Op Note (Signed)
[name], Courtney Velazquez               ACCOUNT NO.:  [id number]   MEDICAL RECORD NO.:  [id number]          PATIENT TYPE:  INP   LOCATION:  LDR1                          FACILITY:  [facility]   PHYSICIAN:  Langley Gauss, M.D.   DATE OF BIRTH:  1981/02/28   DATE OF PROCEDURE:  03/02/2004  DATE OF DISCHARGE:                                 OPERATIVE REPORT   PROCEDURE:  Placement of continuous lumbar epidural analgesia at the L2-L3  interspace.   SURGEON:  Langley Gauss, M.D.   COMPLICATIONS:  None.   DESCRIPTION OF PROCEDURE:  Appropriate informed consent was obtained.  The  patient had epidural with previous labor and delivery.  She was placed in  the seated position.  Bony landmarks were identified.  Continuous electronic  fetal monitoring was performed.  The back was sterilely prepped and draped  utilizing the epidural tray, and 5 cc of 1% lidocaine plain was injected at  the L2-L3 interspace to raise a small skin wheal.  A 17-gauge Tuohy-Schliff  needle was utilized with loss of resistance in an air-filled glass syringe  to identify entry in the epidural space on the first attempt without  difficulty.  Initial test dose of 5 cc of 1.5% lidocaine plus epinephrine  was injected through the epidural needle.  No signs of CSF or intervascular  injection obtained.  The catheter was then inserted to a depth of 5 cm.  The  epidural needle was removed.  Aspiration test was negative.  A second test  dose of 2 cc of 1.5% lidocaine plus epinephrine was injected through the  epidural catheter.  No signs of CSF or intervascular injection obtained.  The catheter was secured into place.  The patient was returned from the  supine position into a left lateral position.  She was examined and noted to  be completely dilated.  To facilitate a rapidly setting block, the patient  was given a bolus of 5, 5 cc of 2% lidocaine plain.  At this point in time,  she is beginning to have relief associated with  placement of the epidural.  Placement of the epidural start time 0130 on March 02, 2004.      DC/MEDQ  D:  03/02/2004  T:  03/02/2004  Job:  [id number]

## 2010-10-26 NOTE — H&P (Signed)
[facility]  Patient:    DAISJA, KESSINGER Visit Number: [id number] MRN: [id number]          Service Type: OBS Location: 4A A419 01 Attending Physician:  Jeri Cos. Dictated by:   Langley Gauss, M.D. Admit Date:  06/29/2001 Discharge Date: 07/01/2001                           History and Physical  HISTORY OF PRESENT ILLNESS:  The patient is a 30 year old gravida 1, para 0, at [redacted] weeks gestation who presented in the early a.m. and noted to be in early stages of labor.  The patient was initially managed by the physician on cross coverage and was admitted at that time.  On examination by the nursing staff at 5 a.m., the patient was 7 cm, at which time I was consulted to place an epidural.  The patient had described the onset of uterine contractions at about 0200 that morning.  She denies any leakage of fluid or any vaginal bleeding.  The patients prenatal course was complicated only by serial first trimester ultrasounds to document viability.  The patient was noted to be treated both for BV with Flagyl and with Terazol for a yeast infection.  Blood type O positive.  GTT normal at 106.  AFP triple screen normal.  PAST MEDICAL HISTORY:  The patient previously had been noted to have hematuria and proteinuria which resolved spontaneously without any workup required.  The patient does have a history of headaches preceding the pregnancy, for which she has taken Fiorinal.  SOCIAL HISTORY:  The patient smokes less than one-half pack per day.  She works as a Therapist, nutritional.  ALLERGIES:  No known drug allergies.  PHYSICAL EXAMINATION:  GENERAL:  Healthy, alert-appearing white female.  VITAL SIGNS:  Blood pressure 135/81.  Height 5 feet 4-1/2 inches. Prepregnancy weight of 113, recent weight 142.  Pulse rate of 80, respiratory rate 20.  HEENT:  Negative.  No adenopathy.  NECK:  Supple.  Thyroid is not palpable.  LUNGS:  Clear.  CARDIOVASCULAR:   Regular rate and rhythm.  ABDOMEN:  Soft and nontender.  No surgical scars are identified.  There is a gravid uterus identified.  Fetal heart tones are auscultated by external fetal monitor in the 150s.  PELVIC:  Examination by the nursing staff revealed the patient to be 7 cm dilated, with vertex presentation confirmed.  External fetal monitor reveals a reassuring fetal heart rate with contractions occurring every three to four minutes.  ASSESSMENT:  Term intrauterine pregnancy, in advanced active labor at this time, with cervix noted to be 7 cm dilated.  Fetal scalp electrode is placed, with resultant amniotomy, findings of clear amniotic fluid.  Thereafter the patient is placed in the fetal position, at which time an epidural analgesic is placed by Dr. Roylene Reason. Lisette Grinder without complications. Dictated by:   Langley Gauss, M.D. Attending Physician:  Jeri Cos. DD:  07/07/01 TD:  07/07/01 Job: [id number]

## 2010-10-26 NOTE — Discharge Summary (Signed)
[facility]  Patient:    Courtney Velazquez, Courtney Velazquez Visit Number: [id number] MRN: [id number]          Service Type: OBS Location: 4A A419 01 Attending Physician:  Jeri Cos. Dictated by:   Langley Gauss, M.D. Admit Date:  06/29/2001 Discharge Date: 07/01/2001                             Discharge Summary  DIAGNOSES:  A 38+ week intrauterine pregnancy in early labor.  PROCEDURE:  June 29, 2001:  Placement of continuous lumbar epidural analgesia.  July 02, 2001:  Spontaneous assisted vaginal delivery of 6 pound 10 ounce female infant delivered over a midline episiotomy and repair.  DISPOSITION:  Patient is breast-feeding at time of discharge.  She will follow up in the office in four weeks time.  She would like to initiate oral contraceptives six weeks postpartum.  DISCHARGE MEDICATIONS:  Tylox #20 for pain associated with midline episiotomy.  LABORATORIES:  Admission hemoglobin and hematocrit 13.1/36.1, postpartum day #1 12.1/33.9.  Patient is unknown group B Strep status, thus she was treated with IV ampicillin during the course of labor only.  HOSPITAL COURSE:  See previous dictation.  This is a 30 year old gravida 1, para 0 delivered in an uncomplicated manner on January ______, 2003.  Epidural catheter was removed without difficulty.  Postpartum the patient did very well.  She bonded well with the infant.  She breast-fed.  She had excellent support both from her family and from her husband.  Thus, she was discharged to home on postpartum day #1 1/2. Dictated by:   Langley Gauss, M.D. Attending Physician:  Jeri Cos. DD:  07/01/01 TD:  07/02/01 Job: [id number]

## 2010-10-26 NOTE — H&P (Signed)
NAMENEELEY, SEDIVY NO.:  [id number]   MEDICAL RECORD NO.:  [id number]           PATIENT TYPE:   LOCATION:                                 FACILITY:   PHYSICIAN:  Langley Gauss, M.D.        DATE OF BIRTH:   DATE OF ADMISSION:  03/01/2004  DATE OF DISCHARGE:  LH                                HISTORY & PHYSICAL   This is a 30 year old, gravida 2, para 1, 38.5 weeks' gestation.  She was  seen in labor and delivery with cervix noted to be 3+ cm dilated, 80%  effaced with a vertex at a zero station and bulging intact membranes.  The  patient had previously desired to deliver after March 04, 2004, but  subsequently her baby shower has been canceled, so the date of delivery is  not important to the patient.  She does, however, have a history of previous  rapid labor and delivery with vaginal delivery of 6 lb., 10 oz. infant  delivered episiotomy without difficulty and epidural analgesic utilized.   PRENATAL COURSE:  She did have frequent headaches during the first  trimester, which responded very well to Fioricet.  About 20 weeks' gestation  they nearly completely resolved with only rare use of Fioricet.  However,  during the last week, she has had increased frequency of the headaches.  She  describes good fetal movement.  No leakage of fluid.  No rupture of  membranes.  No vaginal bleeding.  She has had serial ultrasounds which have  documented normal anatomic survey and good fetal growth.   SOCIAL HISTORY:  Husband, Ines Bloomer, lives in [place] and works for [employer].  The patient, herself, is a nonsmoker.  The patient previously had used  patch, which gave her migraine headaches.  She now says that her husband is  seriously considering a vasectomy for postpartum birth control purposes.  She would like to breastfeed.  Children's pediatrician would be Dr. Phillips Odor.   PAST MEDICAL HISTORY:  June 29, 2001 - Vaginal delivery without  complications utilizing  epidural analgesics.  Other medical and surgical  history negative.  She has no known drug allergies.   CURRENT MEDICATIONS:  Prenatal vitamins and Fioricet on a p.r.n. basis.  No  known drug allergies.   PHYSICAL EXAMINATION:  VITAL SIGNS:  Height is 5 ft. 5 in.; prepregnancy  135, most recent is 156; blood pressure 116/72; pulse rate of 80;  respiratory rate is 20.  HEENT:  Negative.  No adenopathy.  NECK:  Supple.  Thyroid is nonpalpable.  LUNGS:  Clear.  CARDIOVASCULAR:  Regular rate and rhythm.  ABDOMEN:  Soft and nontender.  All surgical scars are identified.  She is  vertex presentation by Leopold's maneuver.  EXTREMITIES:  Noted to be normal.  PELVIC:  Normal external genitalia.  No lesions or ulcerations identified.  Cervix 3+ cm dilated.  Vertex at zero station.  Cervix is, however,  posterior.  Palpable, intact membranes are noted.   LABORATORY STUDIES:  GBS is negative.   ASSESSMENT AND PLAN:  Proceed with amniotomy.  Thereafter we will augment  her and induce labor as clinically indicated.       ___________________________________________  Langley Gauss, M.D.    DC/MEDQ  D:  03/01/2004  T:  03/01/2004  Job:  [id number]

## 2011-03-04 LAB — DIFFERENTIAL
Lymphs Abs: 1
Monocytes Absolute: 0.4
Monocytes Relative: 7
Neutro Abs: 4.4
Neutrophils Relative %: 73

## 2011-03-04 LAB — CBC
HCT: 40
MCHC: 34.7
Platelets: 225
RBC: 4.63
RDW: 12.8
WBC: 6.1

## 2011-03-04 LAB — COMPREHENSIVE METABOLIC PANEL
ALT: 17
Albumin: 3.7
BUN: 10
Calcium: 8.7
Glucose, Bld: 118 — ABNORMAL HIGH
Potassium: 3.6
Sodium: 131 — ABNORMAL LOW
Total Protein: 6.6

## 2011-03-04 LAB — URINALYSIS, ROUTINE W REFLEX MICROSCOPIC
Bilirubin Urine: NEGATIVE
Protein, ur: NEGATIVE
Urobilinogen, UA: 0.2

## 2011-03-04 LAB — URINE MICROSCOPIC-ADD ON

## 2011-03-21 LAB — URINALYSIS, ROUTINE W REFLEX MICROSCOPIC
Bilirubin Urine: NEGATIVE
Glucose, UA: NEGATIVE
Ketones, ur: NEGATIVE
Leukocytes, UA: NEGATIVE
Specific Gravity, Urine: 1.015
pH: 8.5 — ABNORMAL HIGH

## 2011-03-21 LAB — URINE MICROSCOPIC-ADD ON

## 2011-03-21 LAB — CBC
HCT: 43.1
Hemoglobin: 10.9 — ABNORMAL LOW
Hemoglobin: 15.1 — ABNORMAL HIGH
MCHC: 34.9
MCV: 86.5
RBC: 4.98
RDW: 13.2
WBC: 8.1

## 2011-03-21 LAB — BASIC METABOLIC PANEL
CO2: 27
Chloride: 101
GFR calc Af Amer: >60
Glucose, Bld: 101 — ABNORMAL HIGH
Sodium: 136

## 2016-07-12 DIAGNOSIS — Z23 Encounter for immunization: Secondary | ICD-10-CM | POA: Diagnosis not present

## 2017-07-15 DIAGNOSIS — Z6827 Body mass index (BMI) 27.0-27.9, adult: Secondary | ICD-10-CM | POA: Diagnosis not present

## 2017-07-15 DIAGNOSIS — Z01419 Encounter for gynecological examination (general) (routine) without abnormal findings: Secondary | ICD-10-CM | POA: Diagnosis not present

## 2017-07-15 DIAGNOSIS — R102 Pelvic and perineal pain: Secondary | ICD-10-CM | POA: Diagnosis not present

## 2017-07-15 DIAGNOSIS — Z8742 Personal history of other diseases of the female genital tract: Secondary | ICD-10-CM | POA: Diagnosis not present

## 2017-07-31 DIAGNOSIS — R102 Pelvic and perineal pain: Secondary | ICD-10-CM | POA: Diagnosis not present

## 2017-07-31 DIAGNOSIS — Z6827 Body mass index (BMI) 27.0-27.9, adult: Secondary | ICD-10-CM | POA: Diagnosis not present

## 2017-07-31 DIAGNOSIS — Z8742 Personal history of other diseases of the female genital tract: Secondary | ICD-10-CM | POA: Diagnosis not present

## 2017-10-08 DIAGNOSIS — Z6827 Body mass index (BMI) 27.0-27.9, adult: Secondary | ICD-10-CM | POA: Diagnosis not present

## 2017-10-08 DIAGNOSIS — G43909 Migraine, unspecified, not intractable, without status migrainosus: Secondary | ICD-10-CM | POA: Diagnosis not present

## 2017-10-08 DIAGNOSIS — R000 Tachycardia, unspecified: Secondary | ICD-10-CM | POA: Diagnosis not present

## 2017-10-08 DIAGNOSIS — Z0001 Encounter for general adult medical examination with abnormal findings: Secondary | ICD-10-CM | POA: Diagnosis not present

## 2017-10-08 DIAGNOSIS — Z1389 Encounter for screening for other disorder: Secondary | ICD-10-CM | POA: Diagnosis not present

## 2017-10-08 DIAGNOSIS — K580 Irritable bowel syndrome with diarrhea: Secondary | ICD-10-CM | POA: Diagnosis not present

## 2018-02-25 DIAGNOSIS — R350 Frequency of micturition: Secondary | ICD-10-CM | POA: Diagnosis not present

## 2018-02-25 DIAGNOSIS — N3001 Acute cystitis with hematuria: Secondary | ICD-10-CM | POA: Diagnosis not present

## 2018-02-25 DIAGNOSIS — Z6825 Body mass index (BMI) 25.0-25.9, adult: Secondary | ICD-10-CM | POA: Diagnosis not present

## 2018-02-25 DIAGNOSIS — N898 Other specified noninflammatory disorders of vagina: Secondary | ICD-10-CM | POA: Diagnosis not present

## 2019-07-20 DIAGNOSIS — Z6823 Body mass index (BMI) 23.0-23.9, adult: Secondary | ICD-10-CM | POA: Diagnosis not present

## 2019-07-20 DIAGNOSIS — Z1389 Encounter for screening for other disorder: Secondary | ICD-10-CM | POA: Diagnosis not present

## 2019-07-20 DIAGNOSIS — K580 Irritable bowel syndrome with diarrhea: Secondary | ICD-10-CM | POA: Diagnosis not present

## 2019-07-20 DIAGNOSIS — G43109 Migraine with aura, not intractable, without status migrainosus: Secondary | ICD-10-CM | POA: Diagnosis not present

## 2019-07-20 DIAGNOSIS — R000 Tachycardia, unspecified: Secondary | ICD-10-CM | POA: Diagnosis not present

## 2019-07-20 DIAGNOSIS — Z0001 Encounter for general adult medical examination with abnormal findings: Secondary | ICD-10-CM | POA: Diagnosis not present

## 2020-03-09 DIAGNOSIS — Z6824 Body mass index (BMI) 24.0-24.9, adult: Secondary | ICD-10-CM | POA: Diagnosis not present

## 2020-03-09 DIAGNOSIS — Z01419 Encounter for gynecological examination (general) (routine) without abnormal findings: Secondary | ICD-10-CM | POA: Diagnosis not present

## 2020-06-16 DIAGNOSIS — Z1331 Encounter for screening for depression: Secondary | ICD-10-CM | POA: Diagnosis not present

## 2020-06-16 DIAGNOSIS — N644 Mastodynia: Secondary | ICD-10-CM | POA: Diagnosis not present

## 2020-06-16 DIAGNOSIS — Z719 Counseling, unspecified: Secondary | ICD-10-CM | POA: Diagnosis not present

## 2020-06-16 DIAGNOSIS — Z6824 Body mass index (BMI) 24.0-24.9, adult: Secondary | ICD-10-CM | POA: Diagnosis not present

## 2020-07-02 DIAGNOSIS — Z20822 Contact with and (suspected) exposure to covid-19: Secondary | ICD-10-CM | POA: Diagnosis not present

## 2020-08-02 DIAGNOSIS — N644 Mastodynia: Secondary | ICD-10-CM | POA: Diagnosis not present

## 2020-08-02 DIAGNOSIS — N6311 Unspecified lump in the right breast, upper outer quadrant: Secondary | ICD-10-CM | POA: Diagnosis not present

## 2020-08-02 DIAGNOSIS — R922 Inconclusive mammogram: Secondary | ICD-10-CM | POA: Diagnosis not present

## 2020-08-02 DIAGNOSIS — N6315 Unspecified lump in the right breast, overlapping quadrants: Secondary | ICD-10-CM | POA: Diagnosis not present

## 2021-06-26 DIAGNOSIS — E663 Overweight: Secondary | ICD-10-CM | POA: Diagnosis not present

## 2021-06-26 DIAGNOSIS — Z13220 Encounter for screening for lipoid disorders: Secondary | ICD-10-CM | POA: Diagnosis not present

## 2021-06-26 DIAGNOSIS — G43109 Migraine with aura, not intractable, without status migrainosus: Secondary | ICD-10-CM | POA: Diagnosis not present

## 2021-06-26 DIAGNOSIS — Z0000 Encounter for general adult medical examination without abnormal findings: Secondary | ICD-10-CM | POA: Diagnosis not present

## 2021-06-26 DIAGNOSIS — Z1331 Encounter for screening for depression: Secondary | ICD-10-CM | POA: Diagnosis not present

## 2021-06-26 DIAGNOSIS — K580 Irritable bowel syndrome with diarrhea: Secondary | ICD-10-CM | POA: Diagnosis not present

## 2021-06-26 DIAGNOSIS — Z6824 Body mass index (BMI) 24.0-24.9, adult: Secondary | ICD-10-CM | POA: Diagnosis not present

## 2021-06-26 DIAGNOSIS — R000 Tachycardia, unspecified: Secondary | ICD-10-CM | POA: Diagnosis not present

## 2021-06-27 DIAGNOSIS — Z23 Encounter for immunization: Secondary | ICD-10-CM | POA: Diagnosis not present

## 2021-06-28 ENCOUNTER — Encounter (INDEPENDENT_AMBULATORY_CARE_PROVIDER_SITE_OTHER): Payer: Self-pay | Admitting: *Deleted

## 2021-07-03 ENCOUNTER — Other Ambulatory Visit (HOSPITAL_COMMUNITY): Payer: Self-pay | Admitting: Family Medicine

## 2021-07-03 DIAGNOSIS — N6011 Diffuse cystic mastopathy of right breast: Secondary | ICD-10-CM

## 2021-08-07 ENCOUNTER — Encounter (HOSPITAL_COMMUNITY): Payer: Self-pay

## 2021-08-07 ENCOUNTER — Ambulatory Visit (HOSPITAL_COMMUNITY)
Admission: RE | Admit: 2021-08-07 | Discharge: 2021-08-07 | Disposition: A | Discharge status: Home / Self Care | Payer: BC Managed Care – PPO | Source: Ambulatory Visit | Attending: Family Medicine | Admitting: Family Medicine

## 2021-08-07 ENCOUNTER — Other Ambulatory Visit: Payer: Self-pay

## 2021-08-07 DIAGNOSIS — N6011 Diffuse cystic mastopathy of right breast: Secondary | ICD-10-CM | POA: Insufficient documentation

## 2021-08-07 DIAGNOSIS — R922 Inconclusive mammogram: Secondary | ICD-10-CM | POA: Diagnosis not present

## 2021-08-28 ENCOUNTER — Encounter (INDEPENDENT_AMBULATORY_CARE_PROVIDER_SITE_OTHER): Payer: Self-pay | Admitting: Gastroenterology

## 2021-08-28 ENCOUNTER — Ambulatory Visit (INDEPENDENT_AMBULATORY_CARE_PROVIDER_SITE_OTHER): Payer: BC Managed Care – PPO | Admitting: Gastroenterology

## 2021-08-28 ENCOUNTER — Other Ambulatory Visit: Payer: Self-pay

## 2021-08-28 VITALS — BP 108/76 | HR 103 | Temp 97.9°F | Ht 65.0 in | Wt 143.9 lb

## 2021-08-28 DIAGNOSIS — R109 Unspecified abdominal pain: Secondary | ICD-10-CM | POA: Diagnosis not present

## 2021-08-28 DIAGNOSIS — R197 Diarrhea, unspecified: Secondary | ICD-10-CM | POA: Diagnosis not present

## 2021-08-28 MED ORDER — COLESTIPOL HCL 1 G PO TABS: 2.0000 g | ORAL_TABLET | Freq: Two times a day (BID) | ORAL | 1 refills | Status: DC

## 2021-08-28 NOTE — Progress Notes (Signed)
? ?Referring Provider: Assunta Found, MD ?Primary Care Physician:  Assunta Found, MD ?Primary GI Physician: new ? ?Chief Complaint  ?Patient presents with  ? Irritable Bowel Syndrome  ?  New patient. Referred for IBS. Has diarrhea. Worse since having gallbladder removed ( march 2009) has abdominal cramping, bloating, fatigue.   ? ?HPI:   ?Courtney Velazquez is a 41 y.o. female with past medical history of IBS ? ?Patient presenting today as a new patient for IBS. ? ?Patient states she was diagnosed with IBS many years ago, used to only have diarrhea around time of menstrual cycle, in 2009 after cholecystectomy, started having more frequent diarrhea, occurring daily. She states that she starts with abdominal cramping and then will have to run to the restroom, typically occurs soon after eating.  stools are 6 on britsol stool scale, very loose usually, rarely watery in consistency. Stools are yellow, seedy and sometimes with mucus. She states that it does not seem to matter what she eats. Can have up to 6 BMs per day. She reports that having a BM sometimes improves her abdominal cramping, but she still feels bloated. Denies any rectal bleeding or melena. Denies nausea or vomiting. Sometimes has a formed stool maybe 3 times per month.  She states that she often has fecal urgency quickly after eating, typically no fecal incontinence. Has not tried anything for her cramping or diarrhea. Denies dysphagia. No weight loss. Appetite is stable.  ? ?Had recent labs at PCP with TSH, normal per patient, unable to review these at this time. ? 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 ?Last Colonoscopy:never ?Last Endoscopy:never ? ?Recommendations:  ? ? ?Past Medical History:  ?Diagnosis Date  ? IBS (irritable bowel syndrome)   ? ?Past Surgical History:  ?Procedure Laterality Date  ? CHOLECYSTECTOMY  08/2007  ? HYSTEROTOMY  2008   ? partial  ? ?No current outpatient medications on file.  ? ?No current facility-administered medications for this visit.  ? ?Allergies as of 08/28/2021  ? (No Known Allergies)  ? ?Family History  ?Problem Relation Age of Onset  ? Rheum arthritis Mother   ? ? ?Social History  ? ?Socioeconomic History  ? Marital status: Married  ?  Spouse name: Not on file  ? Number of children: Not on file  ? Years of education: Not on file  ? Highest education level: Not on file  ?Occupational History  ? Not on file  ?Tobacco Use  ? Smoking status: Every Day  ?  Types: Cigarettes  ?  Passive exposure: Current  ? Smokeless tobacco: Never  ?Vaping Use  ? Vaping Use: Never used  ?Substance and Sexual Activity  ? Alcohol use: Never  ? Drug use: Never  ? Sexual activity: Not on file  ?Other Topics Concern  ? Not on file  ?Social History Narrative  ? Not on file  ? ?Social Determinants of Health  ? ?Financial Resource Strain: Not on file  ?Food Insecurity: Not on file  ?Transportation Needs: Not on file  ?Physical Activity: Not on file  ?Stress: Not on file  ?Social Connections: Not on file  ? ?Review of systems ?General: negative for malaise, night sweats, fever, chills, weight loss ?Neck: Negative for lumps, goiter, pain and significant neck swelling ?Resp: Negative for cough, wheezing, dyspnea at rest ?CV: Negative for chest pain, leg swelling, palpitations, orthopnea ?GI: denies  melena, hematochezia, nausea, vomiting, constipation, dysphagia, odyonophagia, early satiety or unintentional weight loss. +diarrhea +bloating +abdominal cramping ?MSK: Negative for joint pain or swelling, back pain, and muscle pain. ?Derm: Negative for itching or rash ?Psych: Denies depression, anxiety, memory loss, confusion. No homicidal or suicidal ideation.  ?Heme: Negative for prolonged bleeding, bruising easily, and swollen nodes. ?Endocrine: Negative for cold or heat intolerance, polyuria, polydipsia and goiter. ?Neuro: negative for tremor, gait  imbalance, syncope and seizures. ?The remainder of the review of systems is noncontributory. ? ?Physical Exam: ?BP 108/76 (BP Location: Left Arm, Patient Position: Sitting, Cuff Size: Normal)   Pulse (!) 103   Temp 97.9 ?F (36.6 ?C) (Oral)   Ht 5\' 5"  (1.651 m)   Wt 143 lb 14.4 oz (65.3 kg)   BMI 23.95 kg/m?  ?General:   Alert and oriented. No distress noted. Pleasant and cooperative.  ?Head:  Normocephalic and atraumatic. ?Eyes:  Conjuctiva clear without scleral icterus. ?Mouth:  Oral mucosa pink and moist. Good dentition. No lesions. ?Heart: Normal rate and rhythm, s1 and s2 heart sounds present.  ?Lungs: Clear lung sounds in all lobes. Respirations equal and unlabored. ?Abdomen:  +BS, soft, non-tender and non-distended. No rebound or guarding. No HSM or masses noted. ?Derm: No palmar erythema or jaundice ?Msk:  Symmetrical without gross deformities. Normal posture. ?Extremities:  Without edema. ?Neurologic:  Alert and  oriented x4 ?Psych:  Alert and cooperative. Normal mood and affect. ? ?Invalid input(s): 6 MONTHS  ? ?ASSESSMENT: ?RIO TABER is a 41 y.o. female presenting today for suspected IBS/diarrhea. ? ?Long history of diarrhea, though worsened after cholecystectomy in 2009 with multiple, daily episodes of loose stools, abdominal cramping and bloating. No weight loss, rectal bleeding or melena. I suspect the possibility of both IBS and bile acid diarrhea. I discussed keeping a food journal and trying low FODMAP diet. We will also start colestipol 2g BID to see if this provides any improvement in her symptoms. She had recent labs at PCP and tells me they were all normal (TSH was included in these), I will attempt to obtain these to evaluate for reversible causes of her diarrhea, will also check celiac panel. Patient will let me know if symptoms fail to improve. No indication for endoscopic evaluation at this time as she is without red flag symptoms and is of average risk of CRC, she has new or  worsening symptoms or failure to improve with previously mentioned interventions, will further discuss endoscopic evaluation. ? ? ?PLAN:  ?Start Colestipol 2g BID, do not take within 4 hours of other medications ?2. Obtain recent labs from PCP  ?3. Check celiac panel ?4. Low fodmap diet and food journal ? ?All questions were answered, patient verbalized understanding and is in agreement with plan as outline above.  ? ? ?Follow Up: ?3 months ? ?Teige Rountree L. 2010, MSN, APRN, AGNP-C ?Adult-Gerontology Nurse Practitioner ?[facility] ? ?

## 2021-08-28 NOTE — Patient Instructions (Addendum)
We will check celiac panel for gluten allergy ?I will also obtain labs from PCP to make sure there are no abnormalities with electrolytes or thyroid that could be contributing ?I suspect diarrhea is heavily influenced by lack of gallbladder, I am starting you on colestipol 2g twice a day, please make sure to take this medication 4 hours apart from other medications ?I am also providing you with the low FODMAP diet, you can trial cutting some of these out to see if you notice any difference in your symptoms ?Please let me know if you have new or worsening GI symptoms ? ?Follow up 3 months ?

## 2021-08-30 LAB — CELIAC DISEASE PANEL
(tTG) Ab, IgA: <1 U/mL
(tTG) Ab, IgG: <1 U/mL
Gliadin IgA: 3.5 U/mL
Gliadin IgG: <1 U/mL
Immunoglobulin A: 100 mg/dL (ref 47–310)

## 2021-09-22 ENCOUNTER — Other Ambulatory Visit (INDEPENDENT_AMBULATORY_CARE_PROVIDER_SITE_OTHER): Payer: Self-pay | Admitting: Gastroenterology

## 2021-09-24 NOTE — Telephone Encounter (Signed)
Last seen 08/28/21 for IBS ?

## 2021-10-17 ENCOUNTER — Other Ambulatory Visit (INDEPENDENT_AMBULATORY_CARE_PROVIDER_SITE_OTHER): Payer: Self-pay | Admitting: Gastroenterology

## 2021-10-17 NOTE — Telephone Encounter (Signed)
08/28/2021 last seen. ?

## 2021-10-31 ENCOUNTER — Other Ambulatory Visit (INDEPENDENT_AMBULATORY_CARE_PROVIDER_SITE_OTHER): Payer: Self-pay | Admitting: Gastroenterology

## 2021-11-29 ENCOUNTER — Encounter (INDEPENDENT_AMBULATORY_CARE_PROVIDER_SITE_OTHER): Payer: Self-pay | Admitting: Gastroenterology

## 2021-11-29 ENCOUNTER — Ambulatory Visit (INDEPENDENT_AMBULATORY_CARE_PROVIDER_SITE_OTHER): Payer: BC Managed Care – PPO | Admitting: Gastroenterology

## 2021-12-12 ENCOUNTER — Encounter (INDEPENDENT_AMBULATORY_CARE_PROVIDER_SITE_OTHER): Payer: Self-pay | Admitting: Gastroenterology

## 2021-12-12 ENCOUNTER — Ambulatory Visit (INDEPENDENT_AMBULATORY_CARE_PROVIDER_SITE_OTHER): Payer: BC Managed Care – PPO | Admitting: Gastroenterology

## 2021-12-12 DIAGNOSIS — E739 Lactose intolerance, unspecified: Secondary | ICD-10-CM

## 2021-12-12 DIAGNOSIS — K9089 Other intestinal malabsorption: Secondary | ICD-10-CM

## 2021-12-12 NOTE — Patient Instructions (Signed)
Continue colestipol 1 g twice a day - can try taking both pill in the morning instead Can take lactase enzymes (sold over the counter) prior to eating milk based products.

## 2021-12-12 NOTE — Progress Notes (Signed)
Courtney Velazquez, M.D. Gastroenterology & Hepatology [facility] [address] [place], [place] [address]  Primary Care Physician: Courtney Found, MD [address] [place] [address]  I will communicate my assessment and recommendations to the referring MD via EMR.  Problems: Bile salt induced diarrhea  History of Present Illness: Courtney Velazquez is a 41 y.o. 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  The patient was last seen on 08/28/2021. At that time, the patient was started on colestipol 2 g twice daily.  Celiac panel was checked which was negative.  She was advised to follow a low FODMAP diet.  Patient is currently taking colestipol 1 g BID as she felt contipated with the 2 g BID dosing. She states she is feeling better as her stool is more formed - has soft consistency and has 1 BM per day. States it has also helped with her abdominal pain as she does not have "the acid pain when having bowel movements". She only has bloating when taking milk based products.  Notably, the patient reports that she developed frequent episodes of diarrhea shortly after undergoing cholecystectomy.  She is not following any diet at the moment.  The patient denies having any nausea, vomiting, fever, chills, hematochezia, melena, hematemesis, abdominal distention, abdominal pain, jaundice, pruritus or weight loss.  Last EGD: Never Last Colonoscopy: Never  Past Medical History: Past Medical History:  Diagnosis Date   IBS (irritable bowel syndrome)     Past Surgical History: Past Surgical History:  Procedure Laterality Date   CHOLECYSTECTOMY  08/2007   HYSTEROTOMY  2008   partial    Family History: Family History  Problem Relation Age of Onset   Rheum arthritis Mother     Social History: Social History   Tobacco Use  Smoking Status Every  Day   Packs/day: 0.50   Types: Cigarettes   Passive exposure: Current  Smokeless Tobacco Never   Social History   Substance and Sexual Activity  Alcohol Use Never   Social History   Substance and Sexual Activity  Drug Use Never    Allergies: No Known Allergies  Medications: Current Outpatient Medications  Medication Sig Dispense Refill   colestipol (COLESTID) 1 g tablet TAKE 2 TABLETS BY MOUTH TWICE A DAY 360 tablet 1   No current facility-administered medications for this visit.    Review of Systems: GENERAL: negative for malaise, night sweats HEENT: No changes in hearing or vision, no nose bleeds or other nasal problems. NECK: Negative for lumps, goiter, pain and significant neck swelling RESPIRATORY: Negative for cough, wheezing CARDIOVASCULAR: Negative for chest pain, leg swelling, palpitations, orthopnea GI: SEE HPI MUSCULOSKELETAL: Negative for joint pain or swelling, back pain, and muscle pain. SKIN: Negative for lesions, rash PSYCH: Negative for sleep disturbance, mood disorder and recent psychosocial stressors. HEMATOLOGY Negative for prolonged bleeding, bruising easily, and swollen nodes. ENDOCRINE: Negative for cold or heat intolerance, polyuria, polydipsia and goiter. NEURO: negative for tremor, gait imbalance, syncope and seizures. The remainder of the review of systems is noncontributory.   Physical Exam: BP 110/73 (BP Location: Left Arm, Patient Position: Sitting, Cuff Size: Small)   Pulse (!) 130   Temp 98.5 F (36.9 C) (Oral)   Ht 5\' 5"  (1.651 m)   Wt 146 lb 9.6 oz (66.5 kg)   BMI 24.40 kg/m  GENERAL: The patient is AO x3, in no acute distress. HEENT:  Head is normocephalic and atraumatic. EOMI are intact. Mouth is well hydrated and without lesions. NECK: Supple. No masses LUNGS: Clear to auscultation. No presence of rhonchi/wheezing/rales. Adequate chest expansion HEART: RRR, normal s1 and s2. ABDOMEN: Soft, nontender, no guarding, no  peritoneal signs, and nondistended. BS +. No masses. EXTREMITIES: Without any cyanosis, clubbing, rash, lesions or edema. NEUROLOGIC: AOx3, no focal motor deficit. SKIN: no jaundice, no rashes  Imaging/Labs: as above  I personally reviewed and interpreted the available labs, imaging and endoscopic files.  Impression and Plan: Courtney Velazquez is a 41 y.o. 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  The patient has presented significant improvement while taking colestipol twice a day.  Her symptoms have much more improved.  Given the timeline of her symptoms, it is likely her presentation is related to bile salt induced diarrhea for which she will need to continue taking colestipol 1 g twice a day.  We discussed the possibility of changing the frequency of her medication to 2 g every day, she will try this.  She has also presented some bloating episodes which are related to milk intolerance, she can try taking lactase enzymes if she will consume milk based products  - Continue colestipol 1 g twice a day - can try taking both pill in the morning instead - Can take lactase enzymes prior to eating milk based products.  All questions were answered.      Courtney Frame, MD Gastroenterology and Hepatology [facility]

## 2022-08-07 DIAGNOSIS — R000 Tachycardia, unspecified: Secondary | ICD-10-CM | POA: Diagnosis not present

## 2022-08-07 DIAGNOSIS — G43109 Migraine with aura, not intractable, without status migrainosus: Secondary | ICD-10-CM | POA: Diagnosis not present

## 2022-08-07 DIAGNOSIS — Z0000 Encounter for general adult medical examination without abnormal findings: Secondary | ICD-10-CM | POA: Diagnosis not present

## 2022-08-07 DIAGNOSIS — Z1331 Encounter for screening for depression: Secondary | ICD-10-CM | POA: Diagnosis not present

## 2022-08-07 DIAGNOSIS — E663 Overweight: Secondary | ICD-10-CM | POA: Diagnosis not present

## 2022-08-07 DIAGNOSIS — K580 Irritable bowel syndrome with diarrhea: Secondary | ICD-10-CM | POA: Diagnosis not present

## 2022-08-07 DIAGNOSIS — R928 Other abnormal and inconclusive findings on diagnostic imaging of breast: Secondary | ICD-10-CM | POA: Diagnosis not present

## 2022-08-07 DIAGNOSIS — Z6825 Body mass index (BMI) 25.0-25.9, adult: Secondary | ICD-10-CM | POA: Diagnosis not present

## 2022-08-12 ENCOUNTER — Other Ambulatory Visit (HOSPITAL_COMMUNITY): Payer: Self-pay | Admitting: Family Medicine

## 2022-08-12 DIAGNOSIS — N6001 Solitary cyst of right breast: Secondary | ICD-10-CM

## 2022-08-13 ENCOUNTER — Ambulatory Visit (HOSPITAL_COMMUNITY)
Admission: RE | Admit: 2022-08-13 | Discharge: 2022-08-13 | Disposition: A | Discharge status: Home / Self Care | Payer: BC Managed Care – PPO | Source: Ambulatory Visit | Attending: Family Medicine | Admitting: Family Medicine

## 2022-08-13 ENCOUNTER — Encounter (HOSPITAL_COMMUNITY): Payer: Self-pay

## 2022-08-13 DIAGNOSIS — N6001 Solitary cyst of right breast: Secondary | ICD-10-CM

## 2022-08-13 DIAGNOSIS — R922 Inconclusive mammogram: Secondary | ICD-10-CM | POA: Diagnosis not present

## 2023-06-08 IMAGING — US US BREAST*R* LIMITED INC AXILLA
1 series · 7 of 7 positions shown · non-contrast
Comparison: Previous exams.

CLINICAL DATA: Follow-up for probably benign right breast masses
felt to represent complicated cysts.

EXAM:
DIGITAL DIAGNOSTIC BILATERAL MAMMOGRAM WITH TOMOSYNTHESIS AND CAD;
ULTRASOUND RIGHT BREAST LIMITED
TECHNIQUE: Bilateral digital diagnostic mammography and breast tomosynthesis
was performed. The images were evaluated with computer-aided
detection.; Targeted ultrasound examination of the right breast was
performed

[Series 1: us breast*right* limited inc axilla · 0.07mm/px · 7 of 7 slices shown]
[im 1/7]
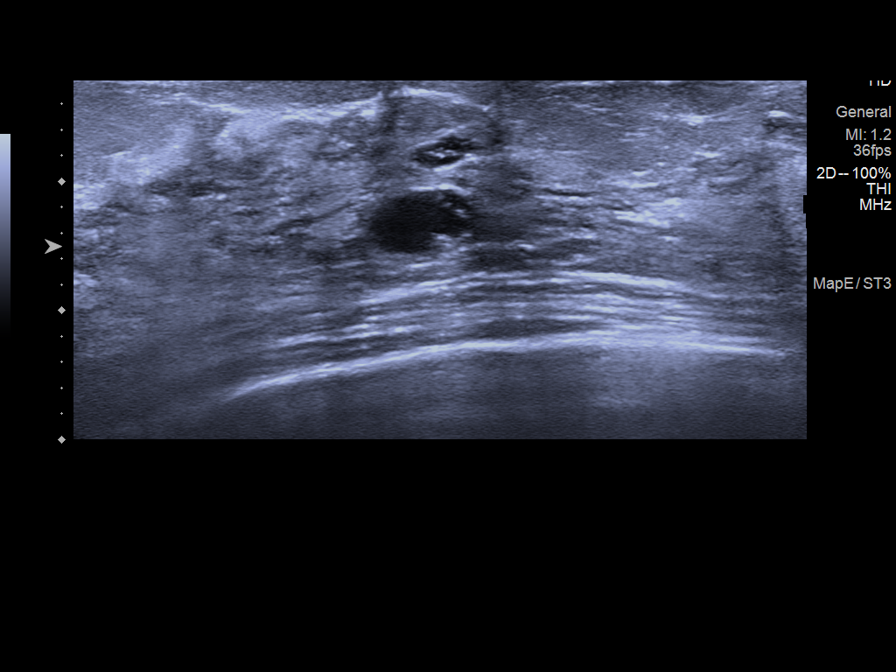
[im 2/7]
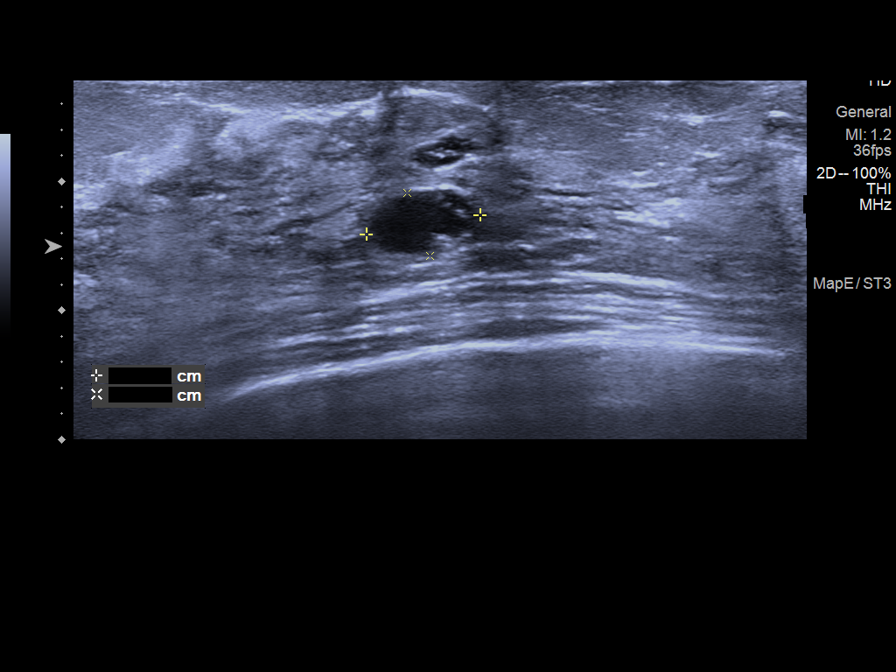
[im 3/7]
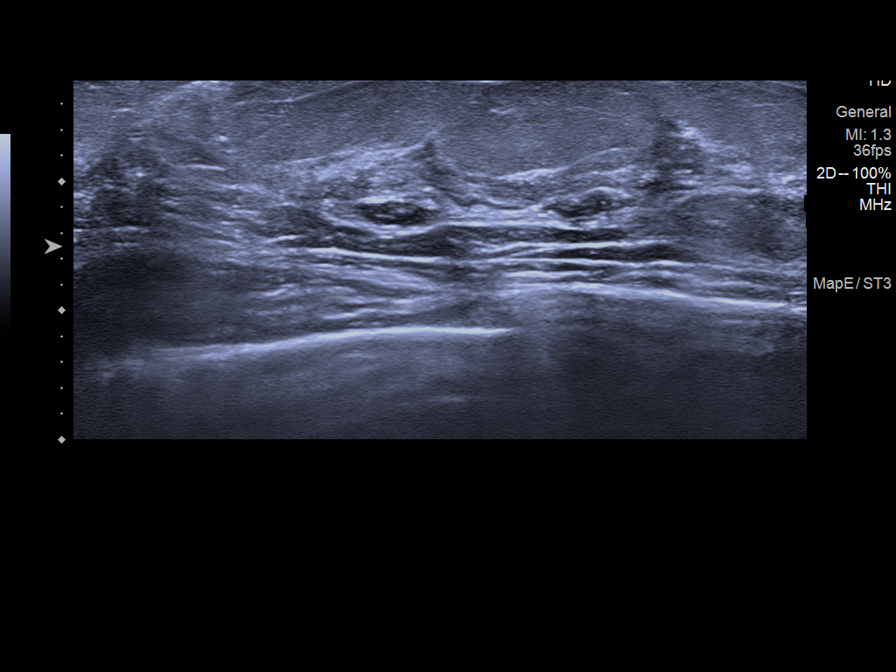
[im 4/7]
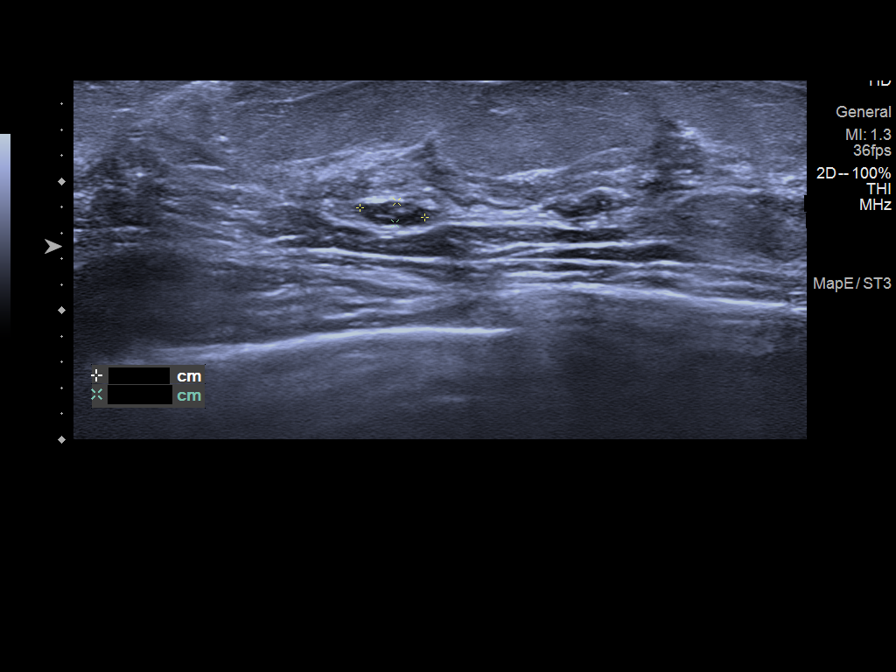
[im 5/7]
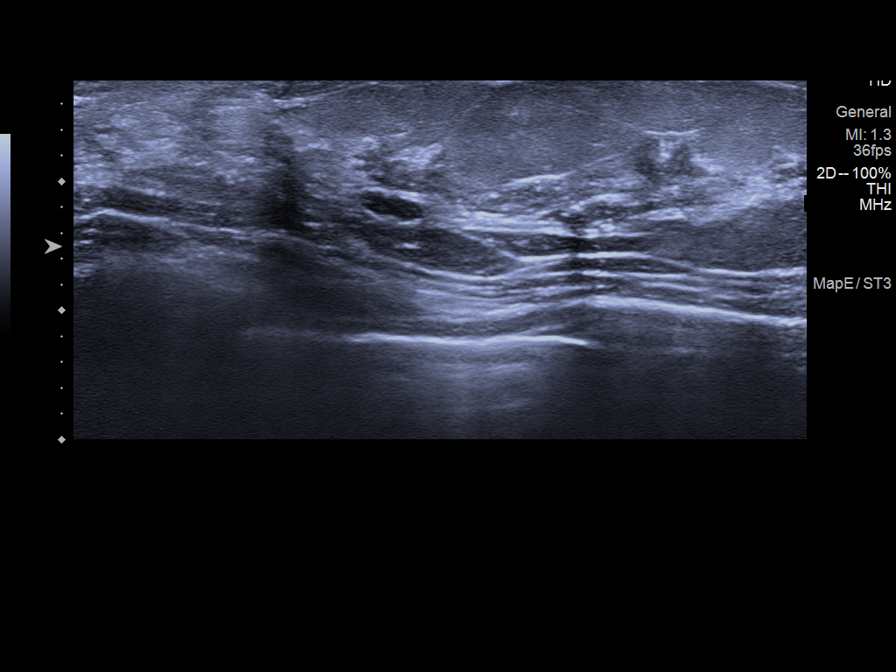
[im 6/7]
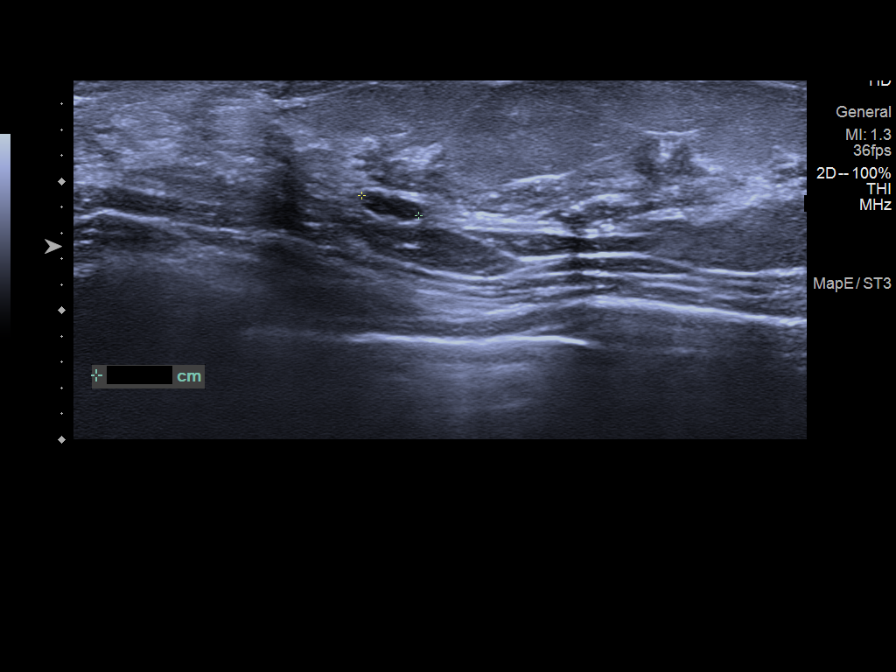
[im 7/7]
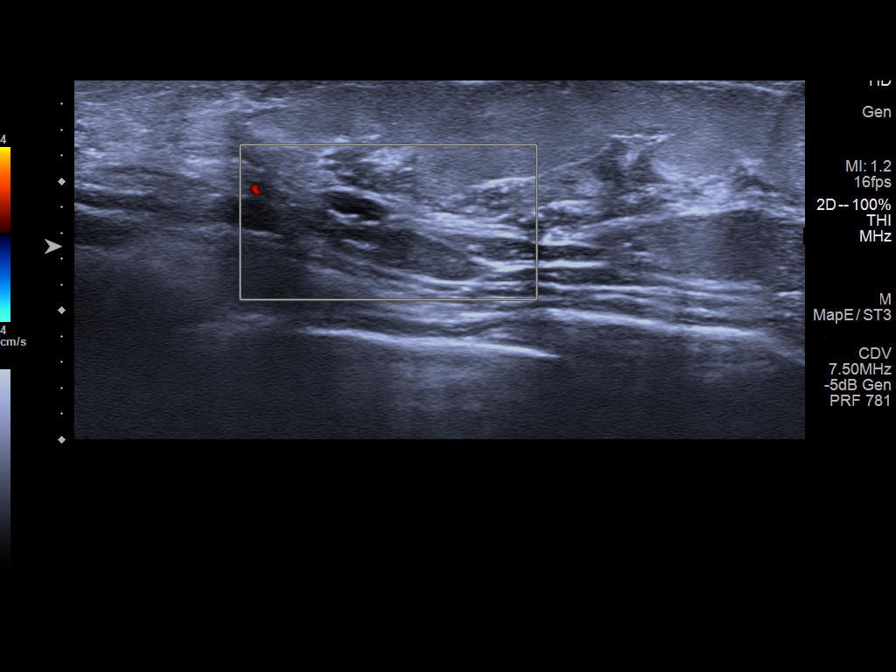

[7 of 7 positions shown; findings below may reference images not displayed]

ACR Breast Density Category d: The breast tissue is extremely dense,
which lowers the sensitivity of mammography.
FINDINGS: No suspicious masses or calcifications seen in either breast.
Multiple oval circumscribed masses are again identified in the
bilateral breasts similar when compared to the prior exam and most
compatible with cysts.

Targeted ultrasound of the lower right breast was performed
demonstrating several areas of fibrocystic change. A septated cyst
in the right breast at 6 o'clock retroareolar measures 1.4 x 0.5 x
0.9 cm. A cyst versus portions of a debris-filled duct in the right
breast at 6 o'clock 3 cm from nipple measures 0.5 x 0.2 x 0.5 cm,
previously 1.1 x 0.3 x 0.8 cm. No suspicious masses or abnormality
seen in the lower right breast.
IMPRESSION: Stable to decreased size of complicated cysts in the lower right
breast. There are no findings of malignancy in either breast.

RECOMMENDATION:
Recommend bilateral diagnostic mammography in 1 year which will
demonstrate 2 years of stability of the probably benign right breast
masses.

I have discussed the findings and recommendations with the patient.
If applicable, a reminder letter will be sent to the patient
regarding the next appointment.

BI-RADS CATEGORY  3: Probably benign.

## 2023-11-26 ENCOUNTER — Encounter: Payer: Self-pay | Admitting: Family Medicine

## 2023-11-26 ENCOUNTER — Other Ambulatory Visit (HOSPITAL_COMMUNITY): Payer: Self-pay | Admitting: Family Medicine

## 2023-11-26 DIAGNOSIS — K580 Irritable bowel syndrome with diarrhea: Secondary | ICD-10-CM | POA: Diagnosis not present

## 2023-11-26 DIAGNOSIS — E663 Overweight: Secondary | ICD-10-CM | POA: Diagnosis not present

## 2023-11-26 DIAGNOSIS — Z1231 Encounter for screening mammogram for malignant neoplasm of breast: Secondary | ICD-10-CM

## 2023-11-26 DIAGNOSIS — Z6825 Body mass index (BMI) 25.0-25.9, adult: Secondary | ICD-10-CM | POA: Diagnosis not present

## 2023-11-26 DIAGNOSIS — Z0000 Encounter for general adult medical examination without abnormal findings: Secondary | ICD-10-CM | POA: Diagnosis not present

## 2023-11-26 DIAGNOSIS — G43109 Migraine with aura, not intractable, without status migrainosus: Secondary | ICD-10-CM | POA: Diagnosis not present

## 2023-11-26 DIAGNOSIS — Z1331 Encounter for screening for depression: Secondary | ICD-10-CM | POA: Diagnosis not present

## 2023-11-26 DIAGNOSIS — Z029 Encounter for administrative examinations, unspecified: Secondary | ICD-10-CM | POA: Diagnosis not present

## 2023-11-26 DIAGNOSIS — R928 Other abnormal and inconclusive findings on diagnostic imaging of breast: Secondary | ICD-10-CM | POA: Diagnosis not present

## 2023-12-15 ENCOUNTER — Ambulatory Visit (HOSPITAL_COMMUNITY)
Admission: RE | Admit: 2023-12-15 | Discharge: 2023-12-15 | Disposition: A | Discharge status: Home / Self Care | Source: Ambulatory Visit | Attending: Family Medicine | Admitting: Family Medicine

## 2023-12-15 DIAGNOSIS — Z1231 Encounter for screening mammogram for malignant neoplasm of breast: Secondary | ICD-10-CM | POA: Insufficient documentation
